# Patient Record
Sex: Female | Born: 1960 | Race: White | Hispanic: No | Marital: Married | Smoking: Current every day smoker
Health system: Southern US, Community
[De-identification: ages and names within clinical notes are randomized; demographics above are authoritative.]

## PROBLEM LIST (undated history)

## (undated) DIAGNOSIS — M8580 Other specified disorders of bone density and structure, unspecified site: Secondary | ICD-10-CM

## (undated) DIAGNOSIS — M1990 Unspecified osteoarthritis, unspecified site: Secondary | ICD-10-CM

## (undated) DIAGNOSIS — Z86010 Personal history of colonic polyps: Secondary | ICD-10-CM

## (undated) DIAGNOSIS — T7840XA Allergy, unspecified, initial encounter: Secondary | ICD-10-CM

## (undated) HISTORY — DX: Unspecified osteoarthritis, unspecified site: M19.90

## (undated) HISTORY — DX: Other specified disorders of bone density and structure, unspecified site: M85.80

## (undated) HISTORY — DX: Allergy, unspecified, initial encounter: T78.40XA

## (undated) HISTORY — PX: NO PAST SURGERIES: SHX2092

## (undated) HISTORY — DX: Personal history of colonic polyps: Z86.010

## (undated) HISTORY — PX: WISDOM TOOTH EXTRACTION: SHX21

---

## 2003-03-09 ENCOUNTER — Other Ambulatory Visit: Admission: RE | Admit: 2003-03-09 | Discharge: 2003-03-09 | Payer: Self-pay | Admitting: Obstetrics and Gynecology

## 2004-04-24 ENCOUNTER — Other Ambulatory Visit: Admission: RE | Admit: 2004-04-24 | Discharge: 2004-04-24 | Payer: Self-pay | Admitting: Obstetrics and Gynecology

## 2009-06-15 ENCOUNTER — Encounter: Admission: RE | Admit: 2009-06-15 | Discharge: 2009-06-15 | Payer: Self-pay | Admitting: Internal Medicine

## 2011-06-30 ENCOUNTER — Other Ambulatory Visit: Payer: Self-pay | Admitting: Obstetrics & Gynecology

## 2011-06-30 DIAGNOSIS — Z1231 Encounter for screening mammogram for malignant neoplasm of breast: Secondary | ICD-10-CM

## 2011-07-29 ENCOUNTER — Ambulatory Visit
Admission: RE | Admit: 2011-07-29 | Discharge: 2011-07-29 | Disposition: A | Discharge status: Home / Self Care | Payer: Commercial Indemnity | Source: Ambulatory Visit | Attending: Obstetrics & Gynecology | Admitting: Obstetrics & Gynecology

## 2011-07-29 DIAGNOSIS — Z1231 Encounter for screening mammogram for malignant neoplasm of breast: Secondary | ICD-10-CM

## 2012-10-05 ENCOUNTER — Other Ambulatory Visit: Payer: Self-pay | Admitting: Obstetrics & Gynecology

## 2012-10-05 DIAGNOSIS — Z1231 Encounter for screening mammogram for malignant neoplasm of breast: Secondary | ICD-10-CM

## 2012-11-01 ENCOUNTER — Ambulatory Visit
Admission: RE | Admit: 2012-11-01 | Discharge: 2012-11-01 | Disposition: A | Discharge status: Home / Self Care | Payer: Commercial Indemnity | Source: Ambulatory Visit | Attending: Obstetrics & Gynecology | Admitting: Obstetrics & Gynecology

## 2012-11-01 DIAGNOSIS — Z1231 Encounter for screening mammogram for malignant neoplasm of breast: Secondary | ICD-10-CM

## 2012-12-22 HISTORY — PX: POLYPECTOMY: SHX149

## 2012-12-22 HISTORY — PX: COLONOSCOPY: SHX174

## 2013-03-07 ENCOUNTER — Encounter: Payer: Self-pay | Admitting: Internal Medicine

## 2013-04-15 ENCOUNTER — Ambulatory Visit (AMBULATORY_SURGERY_CENTER): Payer: Commercial Indemnity

## 2013-04-15 VITALS — Ht 65.0 in | Wt 139.0 lb

## 2013-04-15 DIAGNOSIS — Z1211 Encounter for screening for malignant neoplasm of colon: Secondary | ICD-10-CM

## 2013-04-20 ENCOUNTER — Encounter: Payer: Self-pay | Admitting: Gastroenterology

## 2013-04-22 ENCOUNTER — Encounter: Payer: Commercial Indemnity | Admitting: Internal Medicine

## 2013-04-29 ENCOUNTER — Encounter: Payer: Commercial Indemnity | Admitting: Gastroenterology

## 2013-06-23 ENCOUNTER — Ambulatory Visit (AMBULATORY_SURGERY_CENTER): Payer: Commercial Indemnity | Admitting: Internal Medicine

## 2013-06-23 ENCOUNTER — Encounter: Payer: Self-pay | Admitting: Internal Medicine

## 2013-06-23 VITALS — BP 115/69 | HR 59 | Temp 98.3°F | Resp 17 | Ht 65.0 in | Wt 139.0 lb

## 2013-06-23 DIAGNOSIS — K648 Other hemorrhoids: Secondary | ICD-10-CM

## 2013-06-23 DIAGNOSIS — D129 Benign neoplasm of anus and anal canal: Secondary | ICD-10-CM

## 2013-06-23 DIAGNOSIS — D128 Benign neoplasm of rectum: Secondary | ICD-10-CM

## 2013-06-23 DIAGNOSIS — K644 Residual hemorrhoidal skin tags: Secondary | ICD-10-CM

## 2013-06-23 DIAGNOSIS — Z1211 Encounter for screening for malignant neoplasm of colon: Secondary | ICD-10-CM

## 2013-06-23 DIAGNOSIS — D126 Benign neoplasm of colon, unspecified: Secondary | ICD-10-CM

## 2013-06-23 MED ORDER — SODIUM CHLORIDE 0.9 % IV SOLN: 500.0000 mL | INTRAVENOUS | Status: DC

## 2013-06-23 NOTE — Progress Notes (Signed)
Procedure ends, to recovery, report given and VSS. 

## 2013-06-23 NOTE — Progress Notes (Signed)
0800 Informed pt we needed to place an IV in right arm for her to receive sedation. Pt would not look at me or acknowledge that I was speaking to her.  Cleaned area using protocol and preceded to insert IV catheter. Pt started screaming, crying and whole body was tense..  Immediately had brisk blood return and catheter advanced with no problem.  Asked pt to deep breath and try to relax.  Pt continue to scream and cry. Started IV fluid and taped up catheter. There is no bruising or swelling at site and pt did not verbalize any pain just screaming and crying.

## 2013-06-23 NOTE — Progress Notes (Signed)
No discoloration at site. Denies any pain at site. Stated when applied pressure hurt a little but no drainage or redness at site.

## 2013-06-23 NOTE — Patient Instructions (Addendum)
I found and removed one polyp - from the rectum. You also have hemorrhoids.  If you have hemorrhoid problems (swelling, itching, bleeding) I am able to treat those with an in-office procedure. If you like, please call my office at [phone] to schedule an appointment and I can evaluate you further.  I will let you know pathology results and when to have another routine colonoscopy by mail.  I appreciate the opportunity to care for you. Iva Boop, MD, [facility]  Discharge instructions given with verbal understanding. Handouts on polyps and hemorrhoids given. Hold aspirin and aspirin products for one week. Resume all other medications. YOU HAD AN ENDOSCOPIC PROCEDURE TODAY AT THE [facility]: Refer to the procedure report that was given to you for any specific questions about what was found during the examination.  If the procedure report does not answer your questions, please call your gastroenterologist to clarify.  If you requested that your care partner not be given the details of your procedure findings, then the procedure report has been included in a sealed envelope for you to review at your convenience later.  YOU SHOULD EXPECT: Some feelings of bloating in the abdomen. Passage of more gas than usual.  Walking can help get rid of the air that was put into your GI tract during the procedure and reduce the bloating. If you had a lower endoscopy (such as a colonoscopy or flexible sigmoidoscopy) you may notice spotting of blood in your stool or on the toilet paper. If you underwent a bowel prep for your procedure, then you may not have a normal bowel movement for a few days.  DIET: Your first meal following the procedure should be a light meal and then it is ok to progress to your normal diet.  A half-sandwich or bowl of soup is an example of a good first meal.  Heavy or fried foods are harder to digest and may make you feel nauseous or bloated.  Likewise meals heavy in dairy and  vegetables can cause extra gas to form and this can also increase the bloating.  Drink plenty of fluids but you should avoid alcoholic beverages for 24 hours.  ACTIVITY: Your care partner should take you home directly after the procedure.  You should plan to take it easy, moving slowly for the rest of the day.  You can resume normal activity the day after the procedure however you should NOT DRIVE or use heavy machinery for 24 hours (because of the sedation medicines used during the test).    SYMPTOMS TO REPORT IMMEDIATELY: A gastroenterologist can be reached at any hour.  During normal business hours, 8:30 AM to 5:00 PM Monday through Friday, call [phone].  After hours and on weekends, please call the GI answering service at [phone] who will take a message and have the physician on call contact you.   Following lower endoscopy (colonoscopy or flexible sigmoidoscopy):  Excessive amounts of blood in the stool  Significant tenderness or worsening of abdominal pains  Swelling of the abdomen that is new, acute  Fever of 100F or higher  FOLLOW UP: If any biopsies were taken you will be contacted by phone or by letter within the next 1-3 weeks.  Call your gastroenterologist if you have not heard about the biopsies in 3 weeks.  Our staff will call the home number listed on your records the next business day following your procedure to check on you and address any questions or concerns that  you may have at that time regarding the information given to you following your procedure. This is a courtesy call and so if there is no answer at the home number and we have not heard from you through the emergency physician on call, we will assume that you have returned to your regular daily activities without incident.  SIGNATURES/CONFIDENTIALITY: You and/or your care partner have signed paperwork which will be entered into your electronic medical record.  These signatures attest to the fact that that  the information above on your After Visit Summary has been reviewed and is understood.  Full responsibility of the confidentiality of this discharge information lies with you and/or your care-partner.

## 2013-06-23 NOTE — Progress Notes (Signed)
Called to room to assist during endoscopic procedure.  Patient ID and intended procedure confirmed with present staff. Received instructions for my participation in the procedure from the performing physician.  

## 2013-06-23 NOTE — Op Note (Signed)
 [facility] [address] [place], [address]   COLONOSCOPY PROCEDURE REPORT  PATIENT: Caitlin, Sanchez  MR#: [id number] BIRTHDATE: 07-29-1961 , 51  yrs. old GENDER: Female ENDOSCOPIST: Iva Boop, MD, [facility] REFERRED BY:W.  Buren Kos, M.D. PROCEDURE DATE:  06/23/2013 PROCEDURE:   Colonoscopy with snare polypectomy ASA CLASS:   Class II INDICATIONS:average risk screening and first colonoscopy. MEDICATIONS: Propofol (Diprivan) 280 mg IV, MAC sedation, administered by CRNA, and These medications were titrated to patient response per physician's verbal order  DESCRIPTION OF PROCEDURE:   After the risks benefits and alternatives of the procedure were thoroughly explained, informed consent was obtained.  A digital rectal exam revealed no abnormalities of the rectum.   The LB [id number]  endoscope was introduced through the anus and advanced to the cecum, which was identified by both the appendix and ileocecal valve. No adverse events experienced.   The quality of the prep was Suprep adequate The instrument was then slowly withdrawn as the colon was fully examined.      COLON FINDINGS: A sessile polyp measuring 8 mm in size was found in the rectum.  A polypectomy was performed using snare cautery.  The resection was complete and the polyp tissue was completely retrieved.   The colon mucosa was otherwise normal.   A right colon retroflexion was performed.  Retroflexed views revealed internal/external hemorrhoids. The time to cecum=6 minutes 34 seconds.  Withdrawal time=12 minutes 42 seconds.  The scope was withdrawn and the procedure completed. COMPLICATIONS: There were no complications.  ENDOSCOPIC IMPRESSION: 1.   Sessile polyp measuring 8 mm in size was found in the rectum; polypectomy was performed using snare cautery 2.   The colon mucosa was otherwise normal - adequate prep - first colonoscopy  RECOMMENDATIONS: Timing of repeat colonoscopy  will be determined by pathology findings.   eSigned:  Iva Boop, MD, [facility] 06/23/2013 8:46 AM   cc: Kari Baars, MD and The Patient

## 2013-06-23 NOTE — Progress Notes (Signed)
Patient did not experience any of the following events: a burn prior to discharge; a fall within the facility; wrong site/side/patient/procedure/implant event; or a hospital transfer or hospital admission upon discharge from the facility. (G8907) Patient did not have preoperative order for IV antibiotic SSI prophylaxis. (G8918)  

## 2013-06-23 NOTE — Progress Notes (Signed)
IV site without s/s of swelling or infiltration. Able to move right arm without any problem.

## 2013-06-27 ENCOUNTER — Telehealth: Payer: Self-pay | Admitting: *Deleted

## 2013-06-27 NOTE — Telephone Encounter (Signed)
Message left

## 2013-06-30 ENCOUNTER — Encounter: Payer: Self-pay | Admitting: Internal Medicine

## 2013-06-30 DIAGNOSIS — Z860100 Personal history of colon polyps, unspecified: Secondary | ICD-10-CM | POA: Insufficient documentation

## 2013-06-30 HISTORY — DX: Personal history of colonic polyps: Z86.010

## 2013-06-30 NOTE — Progress Notes (Signed)
Quick Note:  8 mm tubular adenoma Repeat colonoscopy about 06/2018 ______

## 2014-10-16 ENCOUNTER — Ambulatory Visit (INDEPENDENT_AMBULATORY_CARE_PROVIDER_SITE_OTHER): Payer: Managed Care, Other (non HMO) | Admitting: Podiatry

## 2014-10-16 ENCOUNTER — Encounter: Payer: Self-pay | Admitting: Podiatry

## 2014-10-16 VITALS — BP 107/74 | HR 81 | Resp 16

## 2014-10-16 DIAGNOSIS — L600 Ingrowing nail: Secondary | ICD-10-CM

## 2014-10-16 NOTE — Progress Notes (Signed)
Subjective:     Patient ID: Caitlin Sanchez, female   DOB: 1961-08-09, 53 y.o.   MRN: [id number]  HPI patient presents stating my toenail has been bothering me and I wanted to get it checked again   Review of Systems     Objective:   Physical Exam Neurovascular status intact with what appears to be a normal appearing right big toenail with moderate abnormality in the toe itself position wise    Assessment:     Possible small ingrown toenail versus positional deformity creating stress against the bone structure    Plan:     Educated patient on this and do not recommend removing another nail corner. Did apply padding to reduce stress against the toe and we will see if this resolves her symptoms

## 2015-07-17 ENCOUNTER — Emergency Department (HOSPITAL_COMMUNITY): Payer: Commercial Indemnity

## 2015-07-17 ENCOUNTER — Emergency Department (HOSPITAL_COMMUNITY)
Admission: EM | Admit: 2015-07-17 | Discharge: 2015-07-17 | Disposition: A | Discharge status: Home / Self Care | Payer: Commercial Indemnity | Attending: Emergency Medicine | Admitting: Emergency Medicine

## 2015-07-17 ENCOUNTER — Encounter (HOSPITAL_COMMUNITY): Payer: Self-pay | Admitting: Emergency Medicine

## 2015-07-17 DIAGNOSIS — I9589 Other hypotension: Secondary | ICD-10-CM | POA: Diagnosis not present

## 2015-07-17 DIAGNOSIS — Z720 Tobacco use: Secondary | ICD-10-CM | POA: Diagnosis not present

## 2015-07-17 DIAGNOSIS — Z3202 Encounter for pregnancy test, result negative: Secondary | ICD-10-CM | POA: Insufficient documentation

## 2015-07-17 DIAGNOSIS — R1084 Generalized abdominal pain: Secondary | ICD-10-CM | POA: Diagnosis not present

## 2015-07-17 DIAGNOSIS — R1011 Right upper quadrant pain: Secondary | ICD-10-CM

## 2015-07-17 DIAGNOSIS — M1990 Unspecified osteoarthritis, unspecified site: Secondary | ICD-10-CM | POA: Insufficient documentation

## 2015-07-17 LAB — COMPREHENSIVE METABOLIC PANEL
ALT: 14 U/L (ref 14–54)
AST: 20 U/L (ref 15–41)
Albumin: 4.1 g/dL (ref 3.5–5.0)
Alkaline Phosphatase: 41 U/L (ref 38–126)
Anion gap: 9 (ref 5–15)
BUN: 11 mg/dL (ref 6–20)
CHLORIDE: 105 mmol/L (ref 101–111)
CO2: 26 mmol/L (ref 22–32)
CREATININE: 0.81 mg/dL (ref 0.44–1.00)
Calcium: 9.1 mg/dL (ref 8.9–10.3)
GFR calc Af Amer: >60 mL/min (ref >60)
GFR calc non Af Amer: >60 mL/min (ref >60)
GLUCOSE: 97 mg/dL (ref 65–99)
POTASSIUM: 4.4 mmol/L (ref 3.5–5.1)
Sodium: 140 mmol/L (ref 135–145)
TOTAL PROTEIN: 6.8 g/dL (ref 6.5–8.1)
Total Bilirubin: 0.6 mg/dL (ref 0.3–1.2)

## 2015-07-17 LAB — URINALYSIS, ROUTINE W REFLEX MICROSCOPIC
BILIRUBIN URINE: NEGATIVE
Bilirubin Urine: NEGATIVE
GLUCOSE, UA: NEGATIVE mg/dL
GLUCOSE, UA: NEGATIVE mg/dL
HGB URINE DIPSTICK: NEGATIVE
Hgb urine dipstick: NEGATIVE
KETONES UR: NEGATIVE mg/dL
Ketones, ur: 15 mg/dL — AB
Leukocytes, UA: NEGATIVE
NITRITE: NEGATIVE
Nitrite: NEGATIVE
PH: 7 (ref 5.0–8.0)
PH: 5.5 (ref 5.0–8.0)
PROTEIN: NEGATIVE mg/dL
Protein, ur: NEGATIVE mg/dL
SPECIFIC GRAVITY, URINE: 1.01 (ref 1.005–1.030)
Specific Gravity, Urine: 1.023 (ref 1.005–1.030)
UROBILINOGEN UA: 1 mg/dL (ref 0.0–1.0)
Urobilinogen, UA: 0.2 mg/dL (ref 0.0–1.0)

## 2015-07-17 LAB — I-STAT BETA HCG BLOOD, ED (MC, WL, AP ONLY)

## 2015-07-17 LAB — CBC
HEMATOCRIT: 43.7 % (ref 36.0–46.0)
HEMOGLOBIN: 14.9 g/dL (ref 12.0–15.0)
MCH: 32.4 pg (ref 26.0–34.0)
MCHC: 34.1 g/dL (ref 30.0–36.0)
MCV: 95 fL (ref 78.0–100.0)
Platelets: 316 10*3/uL (ref 150–400)
RBC: 4.6 MIL/uL (ref 3.87–5.11)
RDW: 14.3 % (ref 11.5–15.5)
WBC: 8.1 10*3/uL (ref 4.0–10.5)

## 2015-07-17 LAB — URINE MICROSCOPIC-ADD ON

## 2015-07-17 LAB — I-STAT TROPONIN, ED: TROPONIN I, POC: 0.01 ng/mL (ref 0.00–0.08)

## 2015-07-17 LAB — I-STAT CG4 LACTIC ACID, ED: Lactic Acid, Venous: 1.48 mmol/L (ref 0.5–2.0)

## 2015-07-17 LAB — LIPASE, BLOOD: LIPASE: 32 U/L (ref 22–51)

## 2015-07-17 MED ORDER — SODIUM CHLORIDE 0.9 % IV BOLUS (SEPSIS)
1000.0000 mL | Freq: Once | INTRAVENOUS | Status: AC
  Administered 2015-07-17: 1000 mL via INTRAVENOUS

## 2015-07-17 MED ORDER — OXYCODONE-ACETAMINOPHEN 5-325 MG PO TABS: 15.0000 | ORAL_TABLET | ORAL | Status: DC | PRN

## 2015-07-17 MED ORDER — OXYCODONE-ACETAMINOPHEN 5-325 MG PO TABS: 1.0000 | ORAL_TABLET | ORAL | Status: DC | PRN

## 2015-07-17 MED ORDER — FENTANYL CITRATE (PF) 100 MCG/2ML IJ SOLN
100.0000 ug | Freq: Once | INTRAMUSCULAR | Status: AC
Start: 2015-07-17 — End: 2015-07-17
  Administered 2015-07-17: 100 ug via INTRAVENOUS
  Filled 2015-07-17: qty 2

## 2015-07-17 MED ORDER — ONDANSETRON HCL 4 MG/2ML IJ SOLN
4.0000 mg | Freq: Once | INTRAMUSCULAR | Status: AC
  Administered 2015-07-17: 4 mg via INTRAVENOUS
  Filled 2015-07-17: qty 2

## 2015-07-17 MED ORDER — FENTANYL CITRATE (PF) 100 MCG/2ML IJ SOLN
50.0000 ug | Freq: Once | INTRAMUSCULAR | Status: AC
  Administered 2015-07-17: 50 ug via INTRAVENOUS
  Filled 2015-07-17: qty 2

## 2015-07-17 MED ORDER — KETOROLAC TROMETHAMINE 30 MG/ML IJ SOLN
30.0000 mg | Freq: Once | INTRAMUSCULAR | Status: AC
  Administered 2015-07-17: 30 mg via INTRAVENOUS
  Filled 2015-07-17: qty 1

## 2015-07-17 MED ORDER — ONDANSETRON 4 MG PO TBDP: ORAL_TABLET | ORAL | Status: DC

## 2015-07-17 MED ORDER — DEXTROSE 5 % IV SOLN
1.0000 g | Freq: Once | INTRAVENOUS | Status: AC
  Administered 2015-07-17: 1 g via INTRAVENOUS
  Filled 2015-07-17: qty 10

## 2015-07-17 MED ORDER — MORPHINE SULFATE 4 MG/ML IJ SOLN
4.0000 mg | Freq: Once | INTRAMUSCULAR | Status: DC
  Filled 2015-07-17: qty 1

## 2015-07-17 MED ORDER — ONDANSETRON HCL 4 MG/2ML IJ SOLN
4.0000 mg | Freq: Once | INTRAMUSCULAR | Status: AC | PRN
  Administered 2015-07-17: 4 mg via INTRAVENOUS
  Filled 2015-07-17: qty 2

## 2015-07-17 MED ORDER — MORPHINE SULFATE 4 MG/ML IJ SOLN
4.0000 mg | Freq: Once | INTRAMUSCULAR | Status: AC
  Administered 2015-07-17: 4 mg via INTRAVENOUS
  Filled 2015-07-17: qty 1

## 2015-07-17 MED ORDER — MORPHINE SULFATE 4 MG/ML IJ SOLN
4.0000 mg | Freq: Once | INTRAMUSCULAR | Status: DC
Start: 2015-07-17 — End: 2015-07-17

## 2015-07-17 MED ORDER — ONDANSETRON 4 MG PO TBDP
4.0000 mg | ORAL_TABLET | Freq: Once | ORAL | Status: AC
  Administered 2015-07-17: 4 mg via ORAL
  Filled 2015-07-17: qty 1

## 2015-07-17 MED ORDER — IOHEXOL 350 MG/ML SOLN
100.0000 mL | Freq: Once | INTRAVENOUS | Status: AC | PRN
  Administered 2015-07-17: 100 mL via INTRAVENOUS

## 2015-07-17 MED ORDER — OXYCODONE-ACETAMINOPHEN 5-325 MG PO TABS
2.0000 | ORAL_TABLET | Freq: Once | ORAL | Status: AC
  Administered 2015-07-17: 2 via ORAL
  Filled 2015-07-17: qty 2

## 2015-07-17 NOTE — ED Provider Notes (Signed)
CSN: [id number]     Arrival date & time 07/17/15  7893 History   First MD Initiated Contact with Patient 07/17/15 [phone]     Chief Complaint  Patient presents with  . Abdominal Pain     (Consider location/radiation/quality/duration/timing/severity/associated sxs/prior Treatment) HPI Caitlin Sanchez is a 54 y.o. female who comes in for evaluation of sudden onset right upper quadrant pain. Patient states pain started approximately 4:30 AM this morning. She had associated nausea, tried to drink some water and had subsequent watery vomit. She reports the discomfort as a constant dull ache with sharp, intermittent stabbing pain that she rates as a 5/10 now in the ED. She denies any other abdominal pain, pelvic pain, fevers, chills, chest pain, shortness of breath, new cough, urinary symptoms, back pain. Current half pack per day smoker.  Past Medical History  Diagnosis Date  . Arthritis   . Allergy     dust and possibly ASA  . Personal history of colonic adenoma 06/30/2013   Past Surgical History  Procedure Laterality Date  . No past surgeries     Family History  Problem Relation Age of Onset  . Colon cancer Neg Hx    History  Substance Use Topics  . Smoking status: Current Every Day Smoker -- 0.50 packs/day    Types: Cigarettes  . Smokeless tobacco: Never Used  . Alcohol Use: 2.4 oz/week    4 Glasses of wine per week   OB History    No data available     Review of Systems A 10 point review of systems was completed and was negative except for pertinent positives and negatives as mentioned in the history of present illness     Allergies  Aspirin  Home Medications   Prior to Admission medications   Medication Sig Start Date End Date Taking? Authorizing Provider  ibuprofen (ADVIL,MOTRIN) 200 MG tablet Take 400-600 mg by mouth every 6 (six) hours as needed for mild pain or moderate pain.   Yes Historical Provider, MD  ondansetron (ZOFRAN ODT) 4 MG disintegrating tablet 4mg   ODT q4 hours prn nausea/vomit 07/17/15   Comer Locket, PA-C  oxyCODONE-acetaminophen (PERCOCET/ROXICET) 5-325 MG per tablet Take 1-2 tablets by mouth every 4 (four) hours as needed for severe pain. 07/17/15   Comer Locket, PA-C   BP 108/53 mmHg  Pulse 52  Temp(Src) 98.7 F (37.1 C) (Rectal)  Resp 13  SpO2 99%  LMP 07/09/2015 Physical Exam  Constitutional: She is oriented to person, place, and time. She appears well-developed and well-nourished.  HENT:  Head: Normocephalic and atraumatic.  Mouth/Throat: Oropharynx is clear and moist.  Eyes: Conjunctivae are normal. Pupils are equal, round, and reactive to light. Right eye exhibits no discharge. Left eye exhibits no discharge. No scleral icterus.  Neck: Neck supple.  Cardiovascular: Normal rate, regular rhythm, normal heart sounds and intact distal pulses.   Pulses intact, radial and dorsalis pedis equal and bounding bilaterally.  Pulmonary/Chest: Effort normal and breath sounds normal. No respiratory distress. She has no wheezes. She has no rales.  Abdominal: Soft.  Diffuse tenderness throughout abdomen. Abdomen is soft and nondistended. No rebound or guarding. No overt Murphy sign  Musculoskeletal: She exhibits no tenderness.  Neurological: She is alert and oriented to person, place, and time.  Cranial Nerves II-XII grossly intact  Skin: Skin is warm and dry. No rash noted.  Psychiatric: She has a normal mood and affect.  Nursing note and vitals reviewed.   ED Course  Procedures (including  critical care time) Labs Review Labs Reviewed  URINALYSIS, ROUTINE W REFLEX MICROSCOPIC (NOT AT [facility]) - Abnormal; Notable for the following:    APPearance HAZY (*)    Leukocytes, UA SMALL (*)    All other components within normal limits  URINE MICROSCOPIC-ADD ON - Abnormal; Notable for the following:    Squamous Epithelial / LPF MANY (*)    Bacteria, UA MANY (*)    All other components within normal limits  URINALYSIS, ROUTINE W  REFLEX MICROSCOPIC (NOT AT [facility]) - Abnormal; Notable for the following:    Ketones, ur 15 (*)    All other components within normal limits  URINE CULTURE  LIPASE, BLOOD  COMPREHENSIVE METABOLIC PANEL  CBC  I-STAT BETA HCG BLOOD, ED ([facility], [facility], [facility] ONLY)  I-STAT CG4 LACTIC ACID, ED  I-STAT TROPOININ, ED    Imaging Review Ct Angio Chest Aorta W/cm &/or Wo/cm  07/17/2015   CLINICAL DATA:  Abdominal pain. Severe onset of abdominal pain radiating to the RIGHT upper quadrant. Epigastric pain.  EXAM: CT ANGIOGRAPHY CHEST, ABDOMEN AND PELVIS  TECHNIQUE: Multidetector CT imaging through the chest, abdomen and pelvis was performed using the standard protocol during bolus administration of intravenous contrast. Multiplanar reconstructed images and MIPs were obtained and reviewed to evaluate the vascular anatomy.  CONTRAST:  135mL OMNIPAQUE IOHEXOL 350 MG/ML SOLN  COMPARISON:  None.  FINDINGS: CTA CHEST FINDINGS  Bones: Thoracic vertebral body height is within normal limits. No aggressive osseous lesions.  Cardiovascular: Negative for acute aortic abnormality. Pulmonary arteries are adequately opacified. Negative for pulmonary embolism. Heart appears within normal limits.  Lungs: Respiratory motion artifact is present in the lungs. Dependent atelectasis. There is no airspace disease.  Scattered peripheral pulmonary nodules are present. Largest is in the LEFT lower lobe measuring 4 mm (image 84 series 11).  Central airways: Tenacious secretions in the RIGHT side of the trachea. Otherwise normal.  Effusions: None.  Lymphadenopathy: Normal.  Esophagus: Nonspecific distal esophageal thickening is present, most commonly associated with gastroesophageal reflux.  Upper abdomen: See below.  Other: None.  Review of the MIP images confirms the above findings.  CTA ABDOMEN AND PELVIS FINDINGS  Musculoskeletal: Grade II anterolisthesis of L5 on S1 associated with chronic bilateral L5 pars defects. No aggressive osseous lesions.   Lung Bases: See above.  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  Spleen:  Normal.  Gallbladder:  Normal.  Common bile duct:  Normal.  Pancreas:  Normal.  Adrenal glands:  Normal bilaterally.  Kidneys: Normal renal enhancement. No hydronephrosis. No calculi. LEFT ureter appears normal. RIGHT ureter appears normal.  Stomach:  Grossly normal.  Small bowel:  Grossly normal.  Colon: Normal appendix. Large stool burden in the RIGHT colon. The transverse colon is markedly redundant, with redundant loop in the central upper abdomen.  Pelvic Genitourinary: Normal urinary bladder, partially collapsed. Uterus and adnexa appear normal.  Peritoneum: No free air.  No free fluid.  Vascular/lymphatic: As noted above, periportal edema is present in the liver, likely related to volume status. The abdominal aorta and mesenteric vasculature is patent. No stenosis or occlusion. No secondary findings of mesenteric ischemia.  Body Wall: Normal.  Review of the MIP images confirms the above findings.  IMPRESSION: 1. No acute vascular abnormality. 2. Scattered peripheral 4 mm and smaller pulmonary nodules. If the patient is at high risk for bronchogenic  carcinoma, follow-up chest CT at 1 year is recommended. If the patient is at low risk, no follow-up is needed. This recommendation follows the consensus statement: Guidelines for Management of Small Pulmonary Nodules Detected on CT Scans: A Statement from the [employer] as published in Radiology 2005; 237:395-400. 3. Distal esophageal thickening, nonspecific. This is most commonly associated with reflux. 4. Periportal edema likely related to volume status. 5. Redundant transverse colon with large right-sided stool burden.   Electronically Signed   By: Dereck Ligas M.D.   On: 07/17/2015 11:07   Ct Cta Abd/pel W/cm  &/or W/o Cm  07/17/2015   CLINICAL DATA:  Abdominal pain. Severe onset of abdominal pain radiating to the RIGHT upper quadrant. Epigastric pain.  EXAM: CT ANGIOGRAPHY CHEST, ABDOMEN AND PELVIS  TECHNIQUE: Multidetector CT imaging through the chest, abdomen and pelvis was performed using the standard protocol during bolus administration of intravenous contrast. Multiplanar reconstructed images and MIPs were obtained and reviewed to evaluate the vascular anatomy.  CONTRAST:  138mL OMNIPAQUE IOHEXOL 350 MG/ML SOLN  COMPARISON:  None.  FINDINGS: CTA CHEST FINDINGS  Bones: Thoracic vertebral body height is within normal limits. No aggressive osseous lesions.  Cardiovascular: Negative for acute aortic abnormality. Pulmonary arteries are adequately opacified. Negative for pulmonary embolism. Heart appears within normal limits.  Lungs: Respiratory motion artifact is present in the lungs. Dependent atelectasis. There is no airspace disease.  Scattered peripheral pulmonary nodules are present. Largest is in the LEFT lower lobe measuring 4 mm (image 84 series 11).  Central airways: Tenacious secretions in the RIGHT side of the trachea. Otherwise normal.  Effusions: None.  Lymphadenopathy: Normal.  Esophagus: Nonspecific distal esophageal thickening is present, most commonly associated with gastroesophageal reflux.  Upper abdomen: See below.  Other: None.  Review of the MIP images confirms the above findings.  CTA ABDOMEN AND PELVIS FINDINGS  Musculoskeletal: Grade II anterolisthesis of L5 on S1 associated with chronic bilateral L5 pars defects. No aggressive osseous lesions.  Lung Bases: See above.  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  Spleen:  Normal.  Gallbladder:  Normal.  Common bile duct:  Normal.  Pancreas:  Normal.  Adrenal glands:  Normal bilaterally.   Kidneys: Normal renal enhancement. No hydronephrosis. No calculi. LEFT ureter appears normal. RIGHT ureter appears normal.  Stomach:  Grossly normal.  Small bowel:  Grossly normal.  Colon: Normal appendix. Large stool burden in the RIGHT colon. The transverse colon is markedly redundant, with redundant loop in the central upper abdomen.  Pelvic Genitourinary: Normal urinary bladder, partially collapsed. Uterus and adnexa appear normal.  Peritoneum: No free air.  No free fluid.  Vascular/lymphatic: As noted above, periportal edema is present in the liver, likely related to volume status. The abdominal aorta and mesenteric vasculature is patent. No stenosis or occlusion. No secondary findings of mesenteric ischemia.  Body Wall: Normal.  Review of the MIP images confirms the above findings.  IMPRESSION: 1. No acute vascular abnormality. 2. Scattered peripheral 4 mm and smaller pulmonary nodules. If the patient is at high risk for bronchogenic carcinoma, follow-up chest CT at 1 year is recommended. If the patient is at low risk, no follow-up is needed. This recommendation follows the consensus statement: Guidelines for Management of Small Pulmonary Nodules Detected on CT Scans: A Statement from the [employer] as published in Radiology 2005; 237:395-400. 3. Distal  esophageal thickening, nonspecific. This is most commonly associated with reflux. 4. Periportal edema likely related to volume status. 5. Redundant transverse colon with large right-sided stool burden.   Electronically Signed   By: Dereck Ligas M.D.   On: 07/17/2015 11:07   US Abdomen Limited Ruq  07/17/2015   CLINICAL DATA:  Right upper quadrant and epigastric pain for several hours.  EXAM: US ABDOMEN LIMITED - RIGHT UPPER QUADRANT  COMPARISON:  None.  FINDINGS: Gallbladder:  No gallstones or wall thickening visualized. No sonographic Murphy sign noted.  Common bile duct:  Diameter: 4.6 mm  Liver:  No focal lesion identified. Within normal  limits in parenchymal echogenicity.  IMPRESSION: Normal gallbladder.  Right upper quadrant and epigastric pain for several hours. Please select the correct template based on the type of Limited Abdominal US exam - i.e. RUQ, Ascites, Appendix, Pylorus, etc   Electronically Signed   By: Abelardo Diesel M.D.   On: 07/17/2015 09:38     EKG Interpretation None     Meds given in ED:  Medications  ondansetron (ZOFRAN) injection 4 mg (4 mg Intravenous Given 07/17/15 0749)  fentaNYL (SUBLIMAZE) injection 50 mcg (50 mcg Intravenous Given 07/17/15 0749)  fentaNYL (SUBLIMAZE) injection 100 mcg (100 mcg Intravenous Given 07/17/15 0837)  sodium chloride 0.9 % bolus 1,000 mL (0 mLs Intravenous Stopped 07/17/15 0937)  sodium chloride 0.9 % bolus 1,000 mL (0 mLs Intravenous Stopped 07/17/15 0836)  ketorolac (TORADOL) 30 MG/ML injection 30 mg (30 mg Intravenous Given 07/17/15 0950)  sodium chloride 0.9 % bolus 1,000 mL (0 mLs Intravenous Stopped 07/17/15 1056)  iohexol (OMNIPAQUE) 350 MG/ML injection 100 mL (100 mLs Intravenous Contrast Given 07/17/15 1014)  fentaNYL (SUBLIMAZE) injection 50 mcg (50 mcg Intravenous Given 07/17/15 1216)  ketorolac (TORADOL) 30 MG/ML injection 30 mg (30 mg Intravenous Given 07/17/15 1200)  cefTRIAXone (ROCEPHIN) 1 g in dextrose 5 % 50 mL IVPB (0 g Intravenous Stopped 07/17/15 1247)  ondansetron (ZOFRAN) injection 4 mg (4 mg Intravenous Given 07/17/15 1252)  morphine 4 MG/ML injection 4 mg (4 mg Intravenous Given 07/17/15 1336)  oxyCODONE-acetaminophen (PERCOCET/ROXICET) 5-325 MG per tablet 2 tablet (2 tablets Oral Given 07/17/15 1510)  ondansetron (ZOFRAN-ODT) disintegrating tablet 4 mg (4 mg Oral Given 07/17/15 1510)    Discharge Medication List as of 07/17/2015  3:04 PM    START taking these medications   Details  ondansetron (ZOFRAN ODT) 4 MG disintegrating tablet 4mg  ODT q4 hours prn nausea/vomit, Print    oxyCODONE-acetaminophen (PERCOCET/ROXICET) 5-325 MG per tablet Take 15 tablets  by mouth every 4 (four) hours as needed for severe pain., Starting 07/17/2015, Until Discontinued, Print       Filed Vitals:   07/17/15 1326 07/17/15 1330 07/17/15 1415 07/17/15 1500  BP: 97/56 100/64 94/48 108/53  Pulse: 49 49 47 52  Temp:      TempSrc:      Resp: 14 12 15 13   SpO2: 100% 100% 100% 99%    MDM  Vitals stable - WNL -afebrile Pt resting comfortably in ED. PE--patient with diffuse abdominal tenderness, worse in the right upper quadrant epigastrium without any evidence of source. Labwork--labs are noncontributory. Negative troponin, lactic acid. Repeat in and out urinalysis is clean and shows no evidence of infection.  Imaging--CT angiogram of abdomen and pelvis shows no acute vascular abnormalities. There are scattered peripheral 4 mm pulmonary nodules that I discussed with the patient and she will need follow-up CT. There is also nonspecific distal esophageal thickening, periportal edema  likely secondary to volume status and a redundant transverse colon with large right-sided stool burden. Abdominal ultrasound also negative for any acute pathology.  At this time, patient's pain is controlled in the ED. Discussed with my attending, Dr. Reather Converse who also saw and evaluated the patient and agrees it is reasonable to discharge patient home with short course pain medicines and have her follow-up with PCP for further evaluation management of symptoms. There is no evidence of other acute or emergent pathology at this time. Doubt cardiopulmonary etiology. I discussed all relevant lab findings and imaging results with pt and they verbalized understanding. I personally reviewed the imaging and agree with the results as interpreted by the radiologist.  Discussed f/u with PCP within 48 hrs and return precautions, pt very amenable to plan.   Final diagnoses:  Other specified hypotension  Right upper quadrant abdominal pain      Comer Locket, PA-C 07/18/15 2376  Elnora Morrison,  MD 07/18/15 [phone]

## 2015-07-17 NOTE — Discharge Instructions (Signed)
You were evaluated in the ED today for your abdominal pain. There is not appear to be an emergent cause your symptoms at this time. Your exam, labs, ultrasound, CT of your abdomen and chest are all reassuring. It is important for you to follow-up with your PCP for further evaluation and management of your symptoms. Please take your pain medicine as prescribed. You may take 1-2 tablets every 6 hours as needed for pain or every 4 hours if necessary. Do not drive or operate machinery while taking this medicine. Return to ED for worsening symptoms.  Abdominal Pain, Women Abdominal (stomach, pelvic, or belly) pain can be caused by many things. It is important to tell your doctor:  The location of the pain.  Does it come and go or is it present all the time?  Are there things that start the pain (eating certain foods, exercise)?  Are there other symptoms associated with the pain (fever, nausea, vomiting, diarrhea)? All of this is helpful to know when trying to find the cause of the pain. CAUSES   Stomach: virus or bacteria infection, or ulcer.  Intestine: appendicitis (inflamed appendix), regional ileitis (Crohn's disease), ulcerative colitis (inflamed colon), irritable bowel syndrome, diverticulitis (inflamed diverticulum of the colon), or cancer of the stomach or intestine.  Gallbladder disease or stones in the gallbladder.  Kidney disease, kidney stones, or infection.  Pancreas infection or cancer.  Fibromyalgia (pain disorder).  Diseases of the female organs:  Uterus: fibroid (non-cancerous) tumors or infection.  Fallopian tubes: infection or tubal pregnancy.  Ovary: cysts or tumors.  Pelvic adhesions (scar tissue).  Endometriosis (uterus lining tissue growing in the pelvis and on the pelvic organs).  Pelvic congestion syndrome (female organs filling up with blood just before the menstrual period).  Pain with the menstrual period.  Pain with ovulation (producing an  egg).  Pain with an IUD (intrauterine device, birth control) in the uterus.  Cancer of the female organs.  Functional pain (pain not caused by a disease, may improve without treatment).  Psychological pain.  Depression. DIAGNOSIS  Your doctor will decide the seriousness of your pain by doing an examination.  Blood tests.  X-rays.  Ultrasound.  CT scan (computed tomography, special type of X-ray).  MRI (magnetic resonance imaging).  Cultures, for infection.  Barium enema (dye inserted in the large intestine, to better view it with X-rays).  Colonoscopy (looking in intestine with a lighted tube).  Laparoscopy (minor surgery, looking in abdomen with a lighted tube).  Major abdominal exploratory surgery (looking in abdomen with a large incision). TREATMENT  The treatment will depend on the cause of the pain.   Many cases can be observed and treated at home.  Over-the-counter medicines recommended by your caregiver.  Prescription medicine.  Antibiotics, for infection.  Birth control pills, for painful periods or for ovulation pain.  Hormone treatment, for endometriosis.  Nerve blocking injections.  Physical therapy.  Antidepressants.  Counseling with a psychologist or psychiatrist.  Minor or major surgery. HOME CARE INSTRUCTIONS   Do not take laxatives, unless directed by your caregiver.  Take over-the-counter pain medicine only if ordered by your caregiver. Do not take aspirin because it can cause an upset stomach or bleeding.  Try a clear liquid diet (broth or water) as ordered by your caregiver. Slowly move to a bland diet, as tolerated, if the pain is related to the stomach or intestine.  Have a thermometer and take your temperature several times a day, and record it.  Bed  rest and sleep, if it helps the pain.  Avoid sexual intercourse, if it causes pain.  Avoid stressful situations.  Keep your follow-up appointments and tests, as your caregiver  orders.  If the pain does not go away with medicine or surgery, you may try:  Acupuncture.  Relaxation exercises (yoga, meditation).  Group therapy.  Counseling. SEEK MEDICAL CARE IF:   You notice certain foods cause stomach pain.  Your home care treatment is not helping your pain.  You need stronger pain medicine.  You want your IUD removed.  You feel faint or lightheaded.  You develop nausea and vomiting.  You develop a rash.  You are having side effects or an allergy to your medicine. SEEK IMMEDIATE MEDICAL CARE IF:   Your pain does not go away or gets worse.  You have a fever.  Your pain is felt only in portions of the abdomen. The right side could possibly be appendicitis. The left lower portion of the abdomen could be colitis or diverticulitis.  You are passing blood in your stools (bright red or black tarry stools, with or without vomiting).  You have blood in your urine.  You develop chills, with or without a fever.  You pass out. MAKE SURE YOU:   Understand these instructions.  Will watch your condition.  Will get help right away if you are not doing well or get worse. Document Released: 10/05/2007 Document Revised: 04/24/2014 Document Reviewed: 10/25/2009 [facility] Patient Information 2015 [place], [place]. This information is not intended to replace advice given to you by your health care provider. Make sure you discuss any questions you have with your health care provider.

## 2015-07-17 NOTE — ED Notes (Signed)
Patient transported to Ultrasound 

## 2015-07-17 NOTE — ED Notes (Signed)
Severe abd pain sudden when she woke up, RUQ radiating to epigastric.

## 2015-07-20 LAB — URINE CULTURE

## 2016-08-14 ENCOUNTER — Other Ambulatory Visit: Payer: Self-pay | Admitting: Internal Medicine

## 2016-08-14 DIAGNOSIS — R911 Solitary pulmonary nodule: Secondary | ICD-10-CM

## 2016-08-21 ENCOUNTER — Ambulatory Visit
Admission: RE | Admit: 2016-08-21 | Discharge: 2016-08-21 | Disposition: A | Discharge status: Home / Self Care | Payer: Managed Care, Other (non HMO) | Source: Ambulatory Visit | Attending: Internal Medicine | Admitting: Internal Medicine

## 2016-08-21 DIAGNOSIS — R911 Solitary pulmonary nodule: Secondary | ICD-10-CM

## 2016-08-21 MED ORDER — IOPAMIDOL (ISOVUE-300) INJECTION 61%
75.0000 mL | Freq: Once | INTRAVENOUS | Status: AC | PRN
  Administered 2016-08-21: 75 mL via INTRAVENOUS

## 2017-07-22 DIAGNOSIS — M8580 Other specified disorders of bone density and structure, unspecified site: Secondary | ICD-10-CM

## 2017-07-22 HISTORY — DX: Other specified disorders of bone density and structure, unspecified site: M85.80

## 2017-08-04 ENCOUNTER — Ambulatory Visit (INDEPENDENT_AMBULATORY_CARE_PROVIDER_SITE_OTHER): Payer: Managed Care, Other (non HMO) | Admitting: Obstetrics & Gynecology

## 2017-08-04 ENCOUNTER — Other Ambulatory Visit: Payer: Self-pay | Admitting: Gynecology

## 2017-08-04 ENCOUNTER — Encounter: Payer: Self-pay | Admitting: Obstetrics & Gynecology

## 2017-08-04 VITALS — BP 102/70 | Ht 65.5 in | Wt 133.0 lb

## 2017-08-04 DIAGNOSIS — Z1151 Encounter for screening for human papillomavirus (HPV): Secondary | ICD-10-CM

## 2017-08-04 DIAGNOSIS — Z01419 Encounter for gynecological examination (general) (routine) without abnormal findings: Secondary | ICD-10-CM

## 2017-08-04 DIAGNOSIS — Z780 Asymptomatic menopausal state: Secondary | ICD-10-CM | POA: Diagnosis not present

## 2017-08-04 DIAGNOSIS — Z13820 Encounter for screening for osteoporosis: Secondary | ICD-10-CM

## 2017-08-04 LAB — COMPREHENSIVE METABOLIC PANEL
ALK PHOS: 52 U/L (ref 33–130)
ALT: 18 U/L (ref 6–29)
AST: 20 U/L (ref 10–35)
Albumin: 4.5 g/dL (ref 3.6–5.1)
BUN: 16 mg/dL (ref 7–25)
CALCIUM: 9.5 mg/dL (ref 8.6–10.4)
CHLORIDE: 104 mmol/L (ref 98–110)
CO2: 27 mmol/L (ref 20–32)
Creat: 0.78 mg/dL (ref 0.50–1.05)
Glucose, Bld: 89 mg/dL (ref 65–99)
POTASSIUM: 4.5 mmol/L (ref 3.5–5.3)
Sodium: 139 mmol/L (ref 135–146)
TOTAL PROTEIN: 6.8 g/dL (ref 6.1–8.1)
Total Bilirubin: 0.3 mg/dL (ref 0.2–1.2)

## 2017-08-04 LAB — CBC
HEMATOCRIT: 44.3 % (ref 35.0–45.0)
Hemoglobin: 14.7 g/dL (ref 11.7–15.5)
MCH: 32 pg (ref 27.0–33.0)
MCHC: 33.2 g/dL (ref 32.0–36.0)
MCV: 96.5 fL (ref 80.0–100.0)
MPV: 9.6 fL (ref 7.5–12.5)
Platelets: 387 10*3/uL (ref 140–400)
RBC: 4.59 MIL/uL (ref 3.80–5.10)
RDW: 14 % (ref 11.0–15.0)
WBC: 6 10*3/uL (ref 3.8–10.8)

## 2017-08-04 LAB — LIPID PANEL
Cholesterol: 229 mg/dL — ABNORMAL HIGH (ref <200)
HDL: 66 mg/dL (ref >50)
LDL Cholesterol: 148 mg/dL — ABNORMAL HIGH (ref <100)
TRIGLYCERIDES: 76 mg/dL (ref <150)
Total CHOL/HDL Ratio: 3.5 Ratio (ref <5.0)
VLDL: 15 mg/dL (ref <30)

## 2017-08-04 LAB — TSH: TSH: 1.8 m[IU]/L

## 2017-08-04 NOTE — Addendum Note (Signed)
Addended by: Thurnell Garbe A on: 08/04/2017 08:43 AM   Modules accepted: Orders

## 2017-08-04 NOTE — Progress Notes (Signed)
Caitlin Sanchez 27-Jul-1961 [id number]   History:    56 y.o. G0 Married  RP:  Established patient presenting for annual gyn exam  HPI:   Menopause.  No HRT.  No PMB.  No pelvic pain.  No pain with IC.  Very fit, goes to the [employer] ([employer]) regularly.  Breasts wnl.  Mictions/BMs wnl.  Past medical history,surgical history, family history and social history were all reviewed and documented in the EPIC chart.  Gynecologic History Patient's last menstrual period was 07/09/2015. Contraception: post menopausal status Last Pap: 2016. Results were: normal Last mammogram: 2016. Results were: normal No previous BD [place] 5 yrs ago  Obstetric History OB History  Gravida Para Term Preterm AB Living  0 0 0 0 0 0  SAB TAB Ectopic Multiple Live Births  0 0 0 0 0         ROS: A ROS was performed and pertinent positives and negatives are included in the history.  GENERAL: No fevers or chills. HEENT: No change in vision, no earache, sore throat or sinus congestion. NECK: No pain or stiffness. CARDIOVASCULAR: No chest pain or pressure. No palpitations. PULMONARY: No shortness of breath, cough or wheeze. GASTROINTESTINAL: No abdominal pain, nausea, vomiting or diarrhea, melena or bright red blood per rectum. GENITOURINARY: No urinary frequency, urgency, hesitancy or dysuria. MUSCULOSKELETAL: No joint or muscle pain, no back pain, no recent trauma. DERMATOLOGIC: No rash, no itching, no lesions. ENDOCRINE: No polyuria, polydipsia, no heat or cold intolerance. No recent change in weight. HEMATOLOGICAL: No anemia or easy bruising or bleeding. NEUROLOGIC: No headache, seizures, numbness, tingling or weakness. PSYCHIATRIC: No depression, no loss of interest in normal activity or change in sleep pattern.     Exam:   BP 102/70   Ht 5' 5.5" (1.664 m)   Wt 133 lb (60.3 kg)   LMP 07/09/2015   BMI 21.80 kg/m   Body mass index is 21.8 kg/m.  General appearance : Well developed well nourished female. No  acute distress HEENT: Eyes: no retinal hemorrhage or exudates,  Neck supple, trachea midline, no carotid bruits, no thyroidmegaly Lungs: Clear to auscultation, no rhonchi or wheezes, or rib retractions  Heart: Regular rate and rhythm, no murmurs or gallops Breast:Examined in sitting and supine position were symmetrical in appearance, no palpable masses or tenderness,  no skin retraction, no nipple inversion, no nipple discharge, no skin discoloration, no axillary or supraclavicular lymphadenopathy Abdomen: no palpable masses or tenderness, no rebound or guarding Extremities: no edema or skin discoloration or tenderness  Pelvic: Vulva normal  Bartholin, Urethra, Skene Glands: Within normal limits             Vagina: No gross lesions or discharge  Cervix: No gross lesions or discharge.  Pap/HPV done.  Uterus  AV, normal size, shape and consistency, non-tender and mobile  Adnexa  Without masses or tenderness  Anus, small non-thrombosed hemorrhoid,  perineum  normal   Assessment/Plan:  56 y.o. female for annual exam   1. Encounter for routine gynecological examination with Papanicolaou smear of cervix Normal gyn exam.  Pap/HPV done.  Breasts wnl.  Will schedule Screening [facility] at [facility] - TSH - Lipid panel - Vitamin D 1,25 dihydroxy - Hemoglobin A1c  2. Menopause present No HRT.  No PMB.  Vit D supplements, Ca++ in nutrition.  Continue weight bearing physical activity.  Schedule Bone Density here.  Princess Bruins MD, 8:14 AM 08/04/2017

## 2017-08-04 NOTE — Patient Instructions (Signed)
1. Encounter for routine gynecological examination with Papanicolaou smear of cervix Normal gyn exam.  Pap/HPV done.  Breasts wnl.  Will schedule Screening Mammo at the [facility] ([facility]) - TSH - Lipid panel - Vitamin D 1,25 dihydroxy - Hemoglobin A1c  2. Menopause present No HRT.  No PMB.  Vit D supplements, Ca++ in nutrition.  Continue weight bearing physical activity.  Schedule Bone Density here.  Caitlin Sanchez, it was a pleasure to see you today!  Congrats on your fitness!  I will inform you of your results as soon as available.   Health Maintenance for Postmenopausal Women Menopause is a normal process in which your reproductive ability comes to an end. This process happens gradually over a span of months to years, usually between the ages of 45 and 9. Menopause is complete when you have missed 12 consecutive menstrual periods. It is important to talk with your health care provider about some of the most common conditions that affect postmenopausal women, such as heart disease, cancer, and bone loss (osteoporosis). Adopting a healthy lifestyle and getting preventive care can help to promote your health and wellness. Those actions can also lower your chances of developing some of these common conditions. What should I know about menopause? During menopause, you may experience a number of symptoms, such as:  Moderate-to-severe hot flashes.  Night sweats.  Decrease in sex drive.  Mood swings.  Headaches.  Tiredness.  Irritability.  Memory problems.  Insomnia.  Choosing to treat or not to treat menopausal changes is an individual decision that you make with your health care provider. What should I know about hormone replacement therapy and supplements? Hormone therapy products are effective for treating symptoms that are associated with menopause, such as hot flashes and night sweats. Hormone replacement carries certain risks, especially as you become older. If  you are thinking about using estrogen or estrogen with progestin treatments, discuss the benefits and risks with your health care provider. What should I know about heart disease and stroke? Heart disease, heart attack, and stroke become more likely as you age. This may be due, in part, to the hormonal changes that your body experiences during menopause. These can affect how your body processes dietary fats, triglycerides, and cholesterol. Heart attack and stroke are both medical emergencies. There are many things that you can do to help prevent heart disease and stroke:  Have your blood pressure checked at least every 1-2 years. High blood pressure causes heart disease and increases the risk of stroke.  If you are 64-107 years old, ask your health care provider if you should take aspirin to prevent a heart attack or a stroke.  Do not use any tobacco products, including cigarettes, chewing tobacco, or electronic cigarettes. If you need help quitting, ask your health care provider.  It is important to eat a healthy diet and maintain a healthy weight. ? Be sure to include plenty of vegetables, fruits, low-fat dairy products, and lean protein. ? Avoid eating foods that are high in solid fats, added sugars, or salt (sodium).  Get regular exercise. This is one of the most important things that you can do for your health. ? Try to exercise for at least 150 minutes each week. The type of exercise that you do should increase your heart rate and make you sweat. This is known as moderate-intensity exercise. ? Try to do strengthening exercises at least twice each week. Do these in addition to the moderate-intensity exercise.  Know your numbers.Ask your health care provider to check your cholesterol and your blood glucose. Continue to have your blood tested as directed by your health care provider.  What should I know about cancer screening? There are several types of cancer. Take the following steps to  reduce your risk and to catch any cancer development as early as possible. Breast Cancer  Practice breast self-awareness. ? This means understanding how your breasts normally appear and feel. ? It also means doing regular breast self-exams. Let your health care provider know about any changes, no matter how small.  If you are 28 or older, have a clinician do a breast exam (clinical breast exam or CBE) every year. Depending on your age, family history, and medical history, it may be recommended that you also have a yearly breast X-ray (mammogram).  If you have a family history of breast cancer, talk with your health care provider about genetic screening.  If you are at high risk for breast cancer, talk with your health care provider about having an MRI and a mammogram every year.  Breast cancer (BRCA) gene test is recommended for women who have family members with BRCA-related cancers. Results of the assessment will determine the need for genetic counseling and BRCA1 and for BRCA2 testing. BRCA-related cancers include these types: ? Breast. This occurs in males or females. ? Ovarian. ? Tubal. This may also be called fallopian tube cancer. ? Cancer of the abdominal or pelvic lining (peritoneal cancer). ? Prostate. ? Pancreatic.  Cervical, Uterine, and Ovarian Cancer Your health care provider may recommend that you be screened regularly for cancer of the pelvic organs. These include your ovaries, uterus, and vagina. This screening involves a pelvic exam, which includes checking for microscopic changes to the surface of your cervix (Pap test).  For women ages 21-65, health care providers may recommend a pelvic exam and a Pap test every three years. For women ages 24-65, they may recommend the Pap test and pelvic exam, combined with testing for human papilloma virus (HPV), every five years. Some types of HPV increase your risk of cervical cancer. Testing for HPV may also be done on women of any  age who have unclear Pap test results.  Other health care providers may not recommend any screening for nonpregnant women who are considered low risk for pelvic cancer and have no symptoms. Ask your health care provider if a screening pelvic exam is right for you.  If you have had past treatment for cervical cancer or a condition that could lead to cancer, you need Pap tests and screening for cancer for at least 20 years after your treatment. If Pap tests have been discontinued for you, your risk factors (such as having a new sexual partner) need to be reassessed to determine if you should start having screenings again. Some women have medical problems that increase the chance of getting cervical cancer. In these cases, your health care provider may recommend that you have screening and Pap tests more often.  If you have a family history of uterine cancer or ovarian cancer, talk with your health care provider about genetic screening.  If you have vaginal bleeding after reaching menopause, tell your health care provider.  There are currently no reliable tests available to screen for ovarian cancer.  Lung Cancer Lung cancer screening is recommended for adults 73-60 years old who are at high risk for lung cancer because of a history of smoking. A yearly low-dose CT scan of the  lungs is recommended if you:  Currently smoke.  Have a history of at least 30 pack-years of smoking and you currently smoke or have quit within the past 15 years. A pack-year is smoking an average of one pack of cigarettes per day for one year.  Yearly screening should:  Continue until it has been 15 years since you quit.  Stop if you develop a health problem that would prevent you from having lung cancer treatment.  Colorectal Cancer  This type of cancer can be detected and can often be prevented.  Routine colorectal cancer screening usually begins at age 38 and continues through age 14.  If you have risk factors  for colon cancer, your health care provider may recommend that you be screened at an earlier age.  If you have a family history of colorectal cancer, talk with your health care provider about genetic screening.  Your health care provider may also recommend using home test kits to check for hidden blood in your stool.  A small camera at the end of a tube can be used to examine your colon directly (sigmoidoscopy or colonoscopy). This is done to check for the earliest forms of colorectal cancer.  Direct examination of the colon should be repeated every 5-10 years until age 39. However, if early forms of precancerous polyps or small growths are found or if you have a family history or genetic risk for colorectal cancer, you may need to be screened more often.  Skin Cancer  Check your skin from head to toe regularly.  Monitor any moles. Be sure to tell your health care provider: ? About any new moles or changes in moles, especially if there is a change in a mole's shape or color. ? If you have a mole that is larger than the size of a pencil eraser.  If any of your family members has a history of skin cancer, especially at a young age, talk with your health care provider about genetic screening.  Always use sunscreen. Apply sunscreen liberally and repeatedly throughout the day.  Whenever you are outside, protect yourself by wearing long sleeves, pants, a wide-brimmed hat, and sunglasses.  What should I know about osteoporosis? Osteoporosis is a condition in which bone destruction happens more quickly than new bone creation. After menopause, you may be at an increased risk for osteoporosis. To help prevent osteoporosis or the bone fractures that can happen because of osteoporosis, the following is recommended:  If you are 26-79 years old, get at least 1,000 mg of calcium and at least 600 mg of vitamin D per day.  If you are older than age 35 but younger than age 60, get at least 1,200 mg of  calcium and at least 600 mg of vitamin D per day.  If you are older than age 40, get at least 1,200 mg of calcium and at least 800 mg of vitamin D per day.  Smoking and excessive alcohol intake increase the risk of osteoporosis. Eat foods that are rich in calcium and vitamin D, and do weight-bearing exercises several times each week as directed by your health care provider. What should I know about how menopause affects my mental health? Depression may occur at any age, but it is more common as you become older. Common symptoms of depression include:  Low or sad mood.  Changes in sleep patterns.  Changes in appetite or eating patterns.  Feeling an overall lack of motivation or enjoyment of activities that you previously  enjoyed.  Frequent crying spells.  Talk with your health care provider if you think that you are experiencing depression. What should I know about immunizations? It is important that you get and maintain your immunizations. These include:  Tetanus, diphtheria, and pertussis (Tdap) booster vaccine.  Influenza every year before the flu season begins.  Pneumonia vaccine.  Shingles vaccine.  Your health care provider may also recommend other immunizations. This information is not intended to replace advice given to you by your health care provider. Make sure you discuss any questions you have with your health care provider. Document Released: 01/30/2006 Document Revised: 06/27/2016 Document Reviewed: 09/11/2015 Elsevier Interactive Patient Education  2018 [employer].

## 2017-08-05 LAB — HEMOGLOBIN A1C
HEMOGLOBIN A1C: 5.1 % (ref <5.7)
MEAN PLASMA GLUCOSE: 100 mg/dL

## 2017-08-06 LAB — PAP, TP IMAGING W/ HPV RNA, RFLX HPV TYPE 16,18/45: HPV mRNA, High Risk: NOT DETECTED

## 2017-08-07 ENCOUNTER — Encounter: Payer: Self-pay | Admitting: *Deleted

## 2017-08-08 LAB — VITAMIN D 1,25 DIHYDROXY
Vitamin D 1, 25 (OH)2 Total: 43 pg/mL (ref 18–72)
Vitamin D2 1, 25 (OH)2: <8 pg/mL
Vitamin D3 1, 25 (OH)2: 43 pg/mL

## 2017-08-11 ENCOUNTER — Other Ambulatory Visit: Payer: Self-pay | Admitting: Gynecology

## 2017-08-11 ENCOUNTER — Encounter: Payer: Self-pay | Admitting: Gynecology

## 2017-08-11 ENCOUNTER — Ambulatory Visit (INDEPENDENT_AMBULATORY_CARE_PROVIDER_SITE_OTHER): Payer: Managed Care, Other (non HMO)

## 2017-08-11 DIAGNOSIS — M85851 Other specified disorders of bone density and structure, right thigh: Secondary | ICD-10-CM

## 2017-08-11 DIAGNOSIS — Z13820 Encounter for screening for osteoporosis: Secondary | ICD-10-CM

## 2018-10-18 ENCOUNTER — Encounter: Payer: Self-pay | Admitting: Internal Medicine

## 2019-02-28 ENCOUNTER — Ambulatory Visit
Admission: RE | Admit: 2019-02-28 | Discharge: 2019-02-28 | Disposition: A | Discharge status: Home / Self Care | Payer: Managed Care, Other (non HMO) | Source: Ambulatory Visit

## 2019-02-28 ENCOUNTER — Other Ambulatory Visit: Payer: Self-pay | Admitting: Internal Medicine

## 2019-02-28 DIAGNOSIS — Z1231 Encounter for screening mammogram for malignant neoplasm of breast: Secondary | ICD-10-CM

## 2019-09-20 ENCOUNTER — Encounter: Payer: Self-pay | Admitting: Gynecology

## 2020-02-17 ENCOUNTER — Other Ambulatory Visit: Payer: Self-pay | Admitting: Internal Medicine

## 2020-02-17 DIAGNOSIS — E785 Hyperlipidemia, unspecified: Secondary | ICD-10-CM

## 2020-02-27 ENCOUNTER — Ambulatory Visit
Admission: RE | Admit: 2020-02-27 | Discharge: 2020-02-27 | Disposition: A | Discharge status: Home / Self Care | Payer: No Typology Code available for payment source | Source: Ambulatory Visit | Attending: Internal Medicine | Admitting: Internal Medicine

## 2020-02-27 DIAGNOSIS — E785 Hyperlipidemia, unspecified: Secondary | ICD-10-CM

## 2021-02-18 ENCOUNTER — Other Ambulatory Visit: Payer: Self-pay | Admitting: Internal Medicine

## 2021-02-18 ENCOUNTER — Other Ambulatory Visit: Payer: Self-pay

## 2021-02-18 DIAGNOSIS — F17200 Nicotine dependence, unspecified, uncomplicated: Secondary | ICD-10-CM

## 2021-02-26 ENCOUNTER — Other Ambulatory Visit: Payer: Self-pay | Admitting: Internal Medicine

## 2021-02-26 DIAGNOSIS — Z1231 Encounter for screening mammogram for malignant neoplasm of breast: Secondary | ICD-10-CM

## 2021-03-08 ENCOUNTER — Ambulatory Visit: Payer: Managed Care, Other (non HMO)

## 2021-03-22 ENCOUNTER — Other Ambulatory Visit: Payer: Self-pay

## 2021-03-22 ENCOUNTER — Ambulatory Visit
Admission: RE | Admit: 2021-03-22 | Discharge: 2021-03-22 | Disposition: A | Discharge status: Home / Self Care | Payer: Managed Care, Other (non HMO) | Source: Ambulatory Visit | Attending: Internal Medicine | Admitting: Internal Medicine

## 2021-03-22 DIAGNOSIS — F17200 Nicotine dependence, unspecified, uncomplicated: Secondary | ICD-10-CM

## 2021-04-10 ENCOUNTER — Other Ambulatory Visit: Payer: Self-pay

## 2021-04-10 ENCOUNTER — Ambulatory Visit
Admission: RE | Admit: 2021-04-10 | Discharge: 2021-04-10 | Disposition: A | Discharge status: Home / Self Care | Payer: 59 | Source: Ambulatory Visit

## 2021-04-10 DIAGNOSIS — Z1231 Encounter for screening mammogram for malignant neoplasm of breast: Secondary | ICD-10-CM

## 2021-10-07 ENCOUNTER — Encounter: Payer: Self-pay | Admitting: Internal Medicine

## 2021-11-19 ENCOUNTER — Ambulatory Visit (AMBULATORY_SURGERY_CENTER): Payer: 59

## 2021-11-19 ENCOUNTER — Other Ambulatory Visit: Payer: Self-pay

## 2021-11-19 VITALS — Ht 65.0 in | Wt 140.0 lb

## 2021-11-19 DIAGNOSIS — Z860100 Personal history of colon polyps, unspecified: Secondary | ICD-10-CM

## 2021-11-19 MED ORDER — SUTAB 1479-225-188 MG PO TABS: 1.0000 | ORAL_TABLET | ORAL | 0 refills | Status: DC

## 2021-11-19 NOTE — Progress Notes (Signed)
Pre visit completed via phone call; Patient verified name, DOB, and address; No egg or soy allergy known to patient  No issues known to pt with past sedation with any surgeries or procedures Patient denies ever being told they had issues or difficulty with intubation  No FH of Malignant Hyperthermia Pt is not on diet pills Pt is not on home 02  Pt is not on blood thinners  Pt denies issues with constipation at this time;  No A fib or A flutter Pt is fully vaccinated for Covid x 2; Coupon given to pt in PV today, Code to Pharmacy and NO PA's for preps discussed with pt in PV today  Discussed with pt there will be an out-of-pocket cost for prep and that varies from $0 to 70 +  dollars - pt verbalized understanding  Due to the COVID-19 pandemic we are asking patients to follow certain guidelines in PV and the [place]   Pt aware of COVID protocols and [facility] guidelines

## 2021-11-25 ENCOUNTER — Encounter: Payer: Self-pay | Admitting: Internal Medicine

## 2021-11-26 NOTE — Telephone Encounter (Signed)
Patient called in to follow up about previous message

## 2021-11-29 ENCOUNTER — Encounter: Payer: Self-pay | Admitting: Internal Medicine

## 2021-11-29 ENCOUNTER — Other Ambulatory Visit: Payer: Self-pay

## 2021-11-29 ENCOUNTER — Ambulatory Visit (AMBULATORY_SURGERY_CENTER): Payer: 59 | Admitting: Internal Medicine

## 2021-11-29 VITALS — BP 110/66 | HR 73 | Temp 97.5°F | Resp 12 | Ht 65.0 in | Wt 140.0 lb

## 2021-11-29 DIAGNOSIS — D123 Benign neoplasm of transverse colon: Secondary | ICD-10-CM | POA: Diagnosis not present

## 2021-11-29 DIAGNOSIS — Z860100 Personal history of colon polyps, unspecified: Secondary | ICD-10-CM

## 2021-11-29 DIAGNOSIS — D122 Benign neoplasm of ascending colon: Secondary | ICD-10-CM

## 2021-11-29 HISTORY — PX: COLONOSCOPY W/ POLYPECTOMY: SHX1380

## 2021-11-29 MED ORDER — SODIUM CHLORIDE 0.9 % IV SOLN: 500.0000 mL | Freq: Once | INTRAVENOUS | Status: DC

## 2021-11-29 NOTE — Patient Instructions (Addendum)
I found and removed 2 tiny polyps. I will let you know pathology results and when to have another routine colonoscopy by mail and/or My Chart.  I appreciate the opportunity to care for you. Gatha Mayer, MD, FACG   YOU HAD AN ENDOSCOPIC PROCEDURE TODAY AT [facility]:   Refer to the procedure report that was given to you for any specific questions about what was found during the examination.  If the procedure report does not answer your questions, please call your gastroenterologist to clarify.  If you requested that your care partner not be given the details of your procedure findings, then the procedure report has been included in a sealed envelope for you to review at your convenience later.  YOU SHOULD EXPECT: Some feelings of bloating in the abdomen. Passage of more gas than usual.  Walking can help get rid of the air that was put into your GI tract during the procedure and reduce the bloating. If you had a lower endoscopy (such as a colonoscopy or flexible sigmoidoscopy) you may notice spotting of blood in your stool or on the toilet paper. If you underwent a bowel prep for your procedure, you may not have a normal bowel movement for a few days.  Please Note:  You might notice some irritation and congestion in your nose or some drainage.  This is from the oxygen used during your procedure.  There is no need for concern and it should clear up in a day or so.  SYMPTOMS TO REPORT IMMEDIATELY:  Following lower endoscopy (colonoscopy or flexible sigmoidoscopy):  Excessive amounts of blood in the stool  Significant tenderness or worsening of abdominal pains  Swelling of the abdomen that is new, acute  Fever of 100F or higher  For urgent or emergent issues, a gastroenterologist can be reached at any hour by calling [phone]. Do not use MyChart messaging for urgent concerns.    DIET:  We do recommend a small meal at first, but then you may proceed to your regular  diet.  Drink plenty of fluids but you should avoid alcoholic beverages for 24 hours.  ACTIVITY:  You should plan to take it easy for the rest of today and you should NOT DRIVE or use heavy machinery until tomorrow (because of the sedation medicines used during the test).    FOLLOW UP: Our staff will call the number listed on your records 48-72 hours following your procedure to check on you and address any questions or concerns that you may have regarding the information given to you following your procedure. If we do not reach you, we will leave a message.  We will attempt to reach you two times.  During this call, we will ask if you have developed any symptoms of COVID 19. If you develop any symptoms (ie: fever, flu-like symptoms, shortness of breath, cough etc.) before then, please call [phone].  If you test positive for Covid 19 in the 2 weeks post procedure, please call and report this information to [place].    If any biopsies were taken you will be contacted by phone or by letter within the next 1-3 weeks.  Please call us at [phone] if you have not heard about the biopsies in 3 weeks.    SIGNATURES/CONFIDENTIALITY: You and/or your care partner have signed paperwork which will be entered into your electronic medical record.  These signatures attest to the fact that that the information above on your After Visit Summary  has been reviewed and is understood.  Full responsibility of the confidentiality of this discharge information lies with you and/or your care-partner.  

## 2021-11-29 NOTE — Op Note (Signed)
[facility] Patient Name: Caitlin Sanchez Procedure Date: 11/29/2021 8:03 AM MRN: [id number] Endoscopist: Gatha Mayer , MD Age: 60 Referring MD:  Date of Birth: May 25, 1961 Gender: Female Account #: [id number] Procedure:                Colonoscopy Indications:              Surveillance: Personal history of adenomatous                            polyps on last colonoscopy > 5 years ago, Last                            colonoscopy: 2014 Medicines:                Propofol per Anesthesia, Monitored Anesthesia Care Procedure:                Pre-Anesthesia Assessment:                           - Prior to the procedure, a History and Physical                            was performed, and patient medications and                            allergies were reviewed. The patient's tolerance of                            previous anesthesia was also reviewed. The risks                            and benefits of the procedure and the sedation                            options and risks were discussed with the patient.                            All questions were answered, and informed consent                            was obtained. Prior Anticoagulants: The patient has                            taken no previous anticoagulant or antiplatelet                            agents. ASA Grade Assessment: II - A patient with                            mild systemic disease. After reviewing the risks                            and benefits, the patient was deemed in  satisfactory condition to undergo the procedure.                           After obtaining informed consent, the colonoscope                            was passed under direct vision. Throughout the                            procedure, the patient's blood pressure, pulse, and                            oxygen saturations were monitored continuously. The                            PCF-HQ190L Colonoscope  was introduced through the                            anus and advanced to the the cecum, identified by                            appendiceal orifice and ileocecal valve. The                            colonoscopy was somewhat difficult due to                            significant looping. Successful completion of the                            procedure was aided by applying abdominal pressure.                            The patient tolerated the procedure well. The                            quality of the bowel preparation was good. The                            ileocecal valve, appendiceal orifice, and rectum                            were photographed. The bowel preparation used was                            SuTab via split dose instruction. Scope In: 8:08:39 AM Scope Out: 8:31:05 AM Scope Withdrawal Time: 0 hours 16 minutes 12 seconds  Total Procedure Duration: 0 hours 22 minutes 26 seconds  Findings:                 The perianal and digital rectal examinations were                            normal.  Two sessile polyps were found in the transverse                            colon and ascending colon. The polyps were                            diminutive in size. These polyps were removed with                            a cold snare. Resection and retrieval were                            complete. Verification of patient identification                            for the specimen was done. Estimated blood loss was                            minimal.                           The exam was otherwise without abnormality on                            direct and retroflexion views. Complications:            No immediate complications. Estimated Blood Loss:     Estimated blood loss was minimal. Impression:               - Two diminutive polyps in the transverse colon and                            in the ascending colon, removed with a cold snare.                             Resected and retrieved.                           - The examination was otherwise normal on direct                            and retroflexion views.                           - Personal history of colonic polyp 8 mm adenoma                            2014. Recommendation:           - Patient has a contact number available for                            emergencies. The signs and symptoms of potential                            delayed complications were discussed  with the                            patient. Return to normal activities tomorrow.                            Written discharge instructions were provided to the                            patient.                           - Resume previous diet.                           - Continue present medications.                           - Repeat colonoscopy is recommended for                            surveillance. The colonoscopy date will be                            determined after pathology results from today's                            exam become available for review. Gatha Mayer, MD 11/29/2021 8:38:02 AM This report has been signed electronically.

## 2021-11-29 NOTE — Progress Notes (Signed)
[facility] History and Physical   Primary Care Physician:  Ginger Organ., MD   Reason for Procedure:   Hx colon polypo  Plan:    colonoscopy     HPI: Caitlin Sanchez is a 60 y.o. female w/ hx 8 mm adenoma removed from colon in 2019.   Past Medical History:  Diagnosis Date   Arthritis    Osteopenia 07/2017   T score -1.4 FRAX 6%/0.8%   Personal history of colonic adenoma 06/30/2013    Past Surgical History:  Procedure Laterality Date   COLONOSCOPY  2014   CG-MAC-supre (adeq)-sessile polyp/TA   POLYPECTOMY  2014   sessile polyp and TA   WISDOM TOOTH EXTRACTION      Prior to Admission medications   Medication Sig Start Date End Date Taking? Authorizing Provider  DULoxetine (CYMBALTA) 60 MG capsule Take 60 mg by mouth daily. 08/28/21  Yes [provider]  ondansetron (ZOFRAN-ODT) 8 MG disintegrating tablet Take 1 tablet by mouth every 6 (six) hours as needed.    [provider]    Current Outpatient Medications  Medication Sig Dispense Refill   DULoxetine (CYMBALTA) 60 MG capsule Take 60 mg by mouth daily.     ondansetron (ZOFRAN-ODT) 8 MG disintegrating tablet Take 1 tablet by mouth every 6 (six) hours as needed.     Current Facility-Administered Medications  Medication Dose Route Frequency Provider Last Rate Last Admin   0.9 %  sodium chloride infusion  500 mL Intravenous Once Gatha Mayer, MD        Allergies as of 11/29/2021 - Review Complete 11/29/2021  Allergen Reaction Noted   Aspirin Swelling 04/15/2013    Family History  Problem Relation Age of Onset   Cancer Mother 38       PANCREATIC    Hypertension Father    Heart attack Father    Cancer Paternal Aunt        OVARIAN    Colon cancer Neg Hx     Social History   Socioeconomic History   Marital status: Married    Spouse name: Not on file   Number of children: Not on file   Years of education: Not on file   Highest education level: Not on file   Occupational History   Not on file  Tobacco Use   Smoking status: Every Day    Packs/day: 0.50    Types: Cigarettes   Smokeless tobacco: Never  Vaping Use   Vaping Use: Never used  Substance and Sexual Activity   Alcohol use: Not Currently    Alcohol/week: 5.0 standard drinks    Types: 5 Standard drinks or equivalent per week    Comment: 2 glasses of wine a night    Drug use: No   Sexual activity: Yes    Partners: Male    Comment: 1st INTERCOURSE- 44, PARTNERS- 5, MARRIED- 47 YRS   Other Topics Concern   Not on file  Social History Narrative   Not on file   Social Determinants of Health   Financial Resource Strain: Not on file  Food Insecurity: Not on file  Transportation Needs: Not on file  Physical Activity: Not on file  Stress: Not on file  Social Connections: Not on file  Intimate Partner Violence: Not on file    Review of Systems:  All other review of systems negative except as mentioned in the HPI.  Physical Exam: Vital signs BP 130/88   Pulse 85   Temp Marland Kitchen)  97.5 F (36.4 C)   Ht _0  (1.651 m)   Wt 140 lb (63.5 kg)   LMP 07/09/2015   SpO2 99%   BMI 23.30 kg/m   General:   Alert,  Well-developed, well-nourished, pleasant and cooperative in NAD Lungs:  Clear throughout to auscultation.   Heart:  Regular rate and rhythm; no murmurs, clicks, rubs,  or gallops. Abdomen:  Soft, nontender and nondistended. Normal bowel sounds.   Neuro/Psych:  Alert and cooperative. Normal mood and affect. A and O x 3   _1  E. Carlean Purl, MD, [facility] [phone] (pager) 11/29/2021 8:01 AM@

## 2021-11-29 NOTE — Progress Notes (Signed)
Called to room to assist during endoscopic procedure.  Patient ID and intended procedure confirmed with present staff. Received instructions for my participation in the procedure from the performing physician.  

## 2021-11-29 NOTE — Progress Notes (Signed)
Pt's states no medical or surgical changes since previsit or office visit. VS by CW. 

## 2021-11-29 NOTE — Progress Notes (Signed)
PT taken to PACU. Monitors in place. VSS. Report given to RN. 

## 2021-12-03 ENCOUNTER — Telehealth: Payer: Self-pay

## 2021-12-03 NOTE — Telephone Encounter (Signed)
Telephone call to patient, VM obtained and message left that RN was calling to check on her after procedure and will try and call back later today. SChaplin, RN,BSN

## 2021-12-06 ENCOUNTER — Encounter: Payer: Self-pay | Admitting: Internal Medicine

## 2022-03-18 ENCOUNTER — Other Ambulatory Visit: Payer: Self-pay | Admitting: Internal Medicine

## 2022-03-18 DIAGNOSIS — F17200 Nicotine dependence, unspecified, uncomplicated: Secondary | ICD-10-CM

## 2022-04-14 ENCOUNTER — Other Ambulatory Visit (HOSPITAL_COMMUNITY): Payer: Self-pay | Admitting: Radiology

## 2022-04-14 DIAGNOSIS — J432 Centrilobular emphysema: Secondary | ICD-10-CM

## 2022-04-18 ENCOUNTER — Ambulatory Visit (HOSPITAL_COMMUNITY)
Admission: RE | Admit: 2022-04-18 | Discharge: 2022-04-18 | Disposition: A | Discharge status: Home / Self Care | Payer: Managed Care, Other (non HMO) | Source: Ambulatory Visit | Attending: Internal Medicine | Admitting: Internal Medicine

## 2022-04-18 DIAGNOSIS — J432 Centrilobular emphysema: Secondary | ICD-10-CM | POA: Diagnosis present

## 2022-04-18 LAB — PULMONARY FUNCTION TEST
DL/VA % pred: 109 %
DL/VA: 4.58 ml/min/mmHg/L
DLCO unc % pred: 106 %
DLCO unc: 22.23 ml/min/mmHg
FEF 25-75 Post: 3.19 L/sec
FEF 25-75 Pre: 2.8 L/sec
FEF2575-%Change-Post: 14 %
FEF2575-%Pred-Post: 132 %
FEF2575-%Pred-Pre: 116 %
FEV1-%Change-Post: 3 %
FEV1-%Pred-Post: 110 %
FEV1-%Pred-Pre: 106 %
FEV1-Post: 2.93 L
FEV1-Pre: 2.83 L
FEV1FVC-%Change-Post: 6 %
FEV1FVC-%Pred-Pre: 101 %
FEV6-%Change-Post: -1 %
FEV6-%Pred-Post: 104 %
FEV6-%Pred-Pre: 106 %
FEV6-Post: 3.47 L
FEV6-Pre: 3.53 L
FEV6FVC-%Change-Post: 0 %
FEV6FVC-%Pred-Post: 103 %
FEV6FVC-%Pred-Pre: 103 %
FVC-%Change-Post: -3 %
FVC-%Pred-Post: 101 %
FVC-%Pred-Pre: 104 %
FVC-Post: 3.47 L
FVC-Pre: 3.57 L
Post FEV1/FVC ratio: 85 %
Post FEV6/FVC ratio: 100 %
Pre FEV1/FVC ratio: 79 %
Pre FEV6/FVC Ratio: 100 %
RV % pred: 107 %
RV: 2.19 L
TLC % pred: 107 %
TLC: 5.62 L

## 2022-04-18 MED ORDER — ALBUTEROL SULFATE (2.5 MG/3ML) 0.083% IN NEBU
2.5000 mg | INHALATION_SOLUTION | Freq: Once | RESPIRATORY_TRACT | Status: AC
  Administered 2022-04-18: 2.5 mg via RESPIRATORY_TRACT

## 2022-04-23 ENCOUNTER — Ambulatory Visit
Admission: RE | Admit: 2022-04-23 | Discharge: 2022-04-23 | Disposition: A | Discharge status: Home / Self Care | Payer: Managed Care, Other (non HMO) | Source: Ambulatory Visit | Attending: Internal Medicine | Admitting: Internal Medicine

## 2022-04-23 DIAGNOSIS — F17200 Nicotine dependence, unspecified, uncomplicated: Secondary | ICD-10-CM

## 2023-02-02 IMAGING — CT CT CHEST LUNG CANCER SCREENING LOW DOSE W/O CM
2 of 5 series · 15 of 40 positions shown, 18 images · non-contrast
Comparison: Low-dose lung cancer screening CT chest dated
03/22/2021

CLINICAL DATA: 6-year-old female current smoker, with 21 pack-year
history of smoking, for follow-up lung cancer screening



[Series 4: lung 1.00 br44 cor · coronal · 0.57mm/px · 3 of 296 slices shown]
[im 60/296  lung]
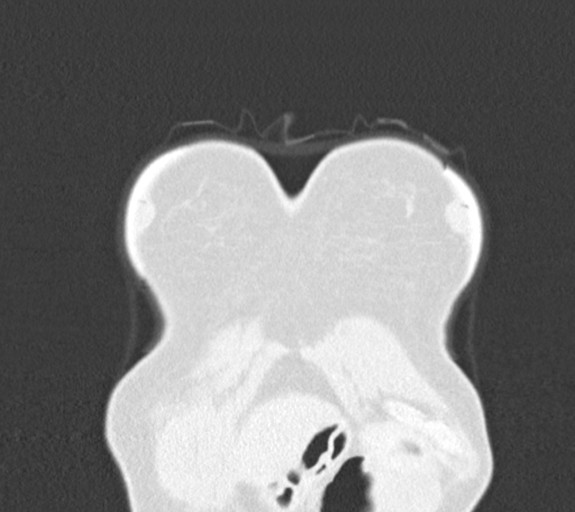
[im 119/296  lung]
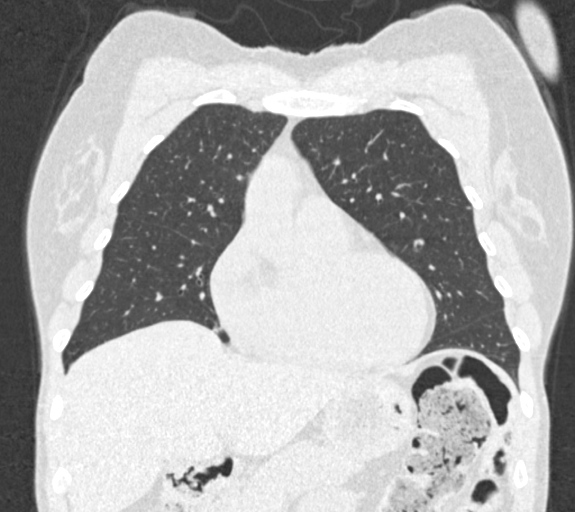
[im 178/296  lung]
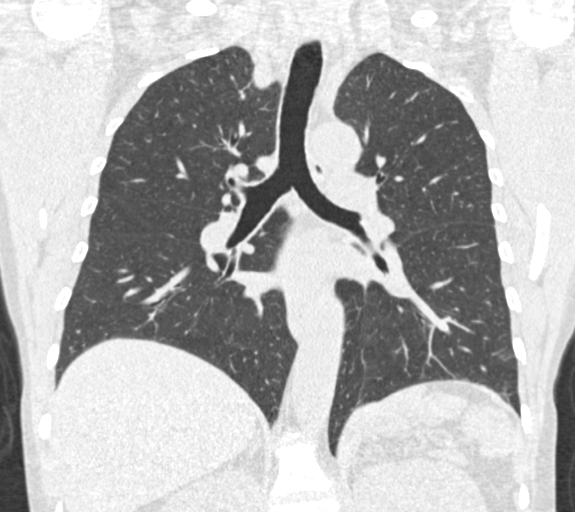

[Series 9: lung 1.00 br60 axial · axial · 0.64mm/px · z∈[-1140,-875]mm · 12 of 293 slices shown, 15 images]
[im 14/293  mediastinal]
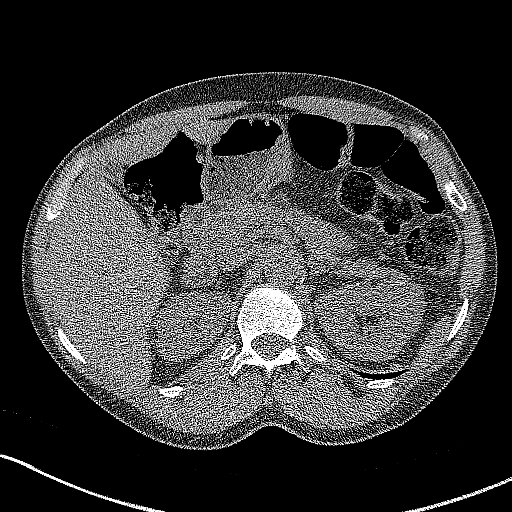
[im 14/293  lung]
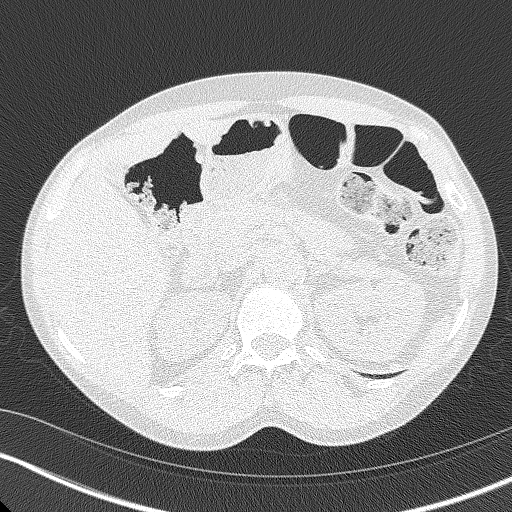
[im 40/293  lung]
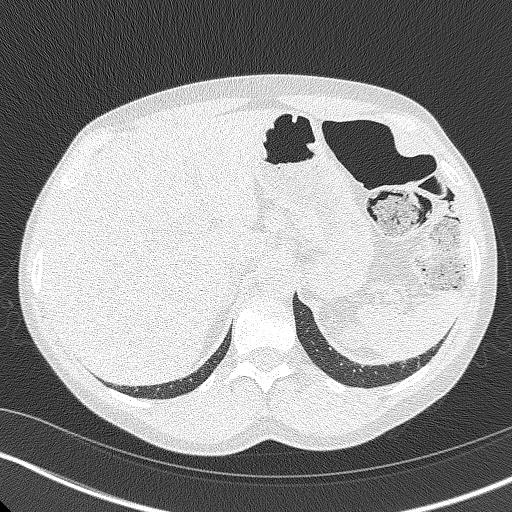
[im 67/293  lung]
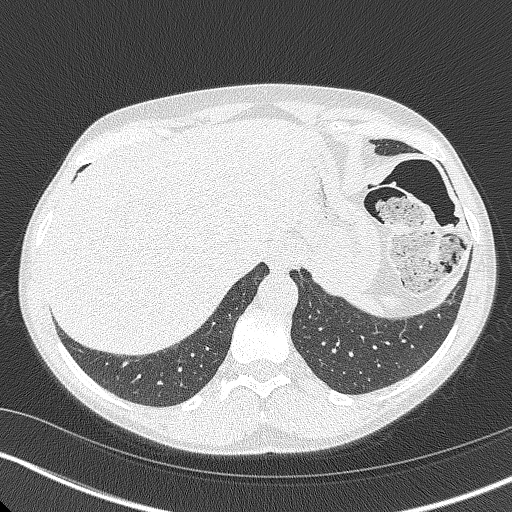
[im 93/293  lung]
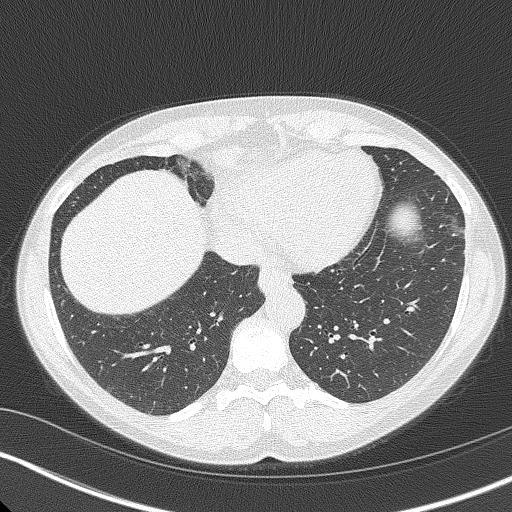
[im 107/293  mediastinal]
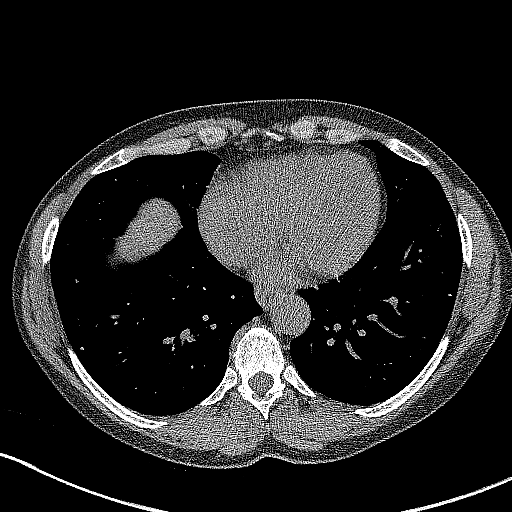
[im 107/293  lung]
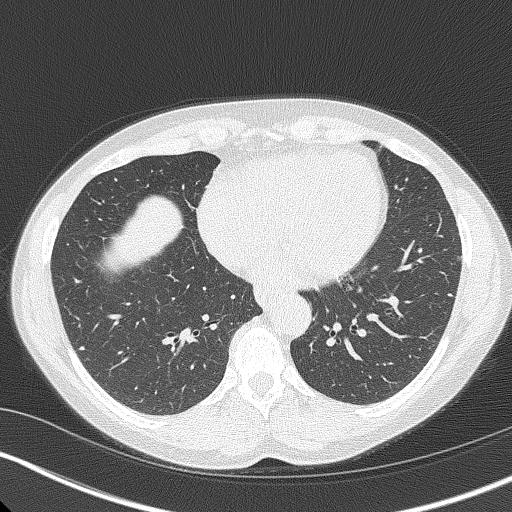
[im 133/293  lung]
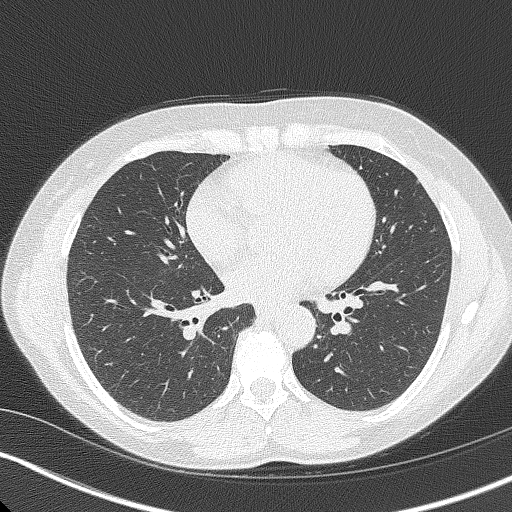
[im 160/293  lung]
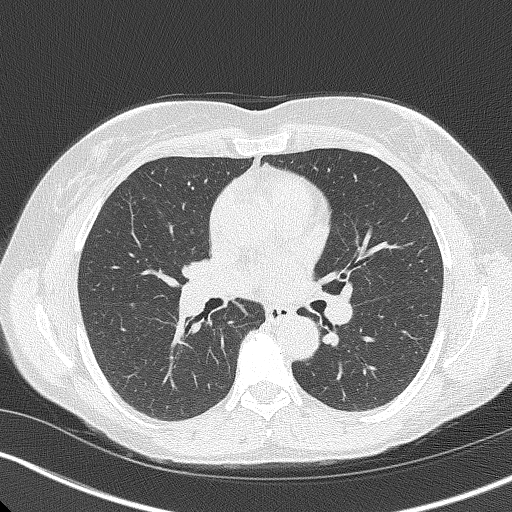
[im 186/293  lung]
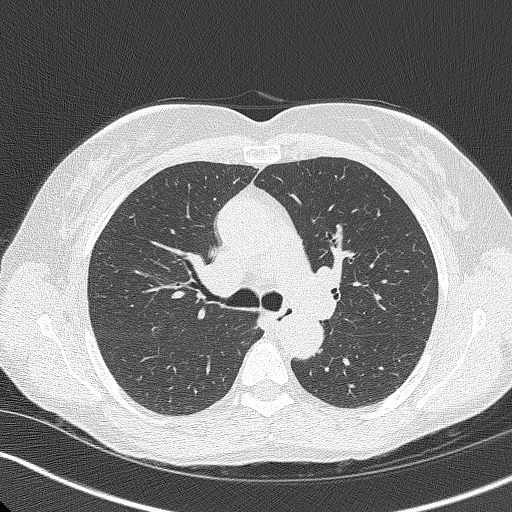
[im 200/293  mediastinal]
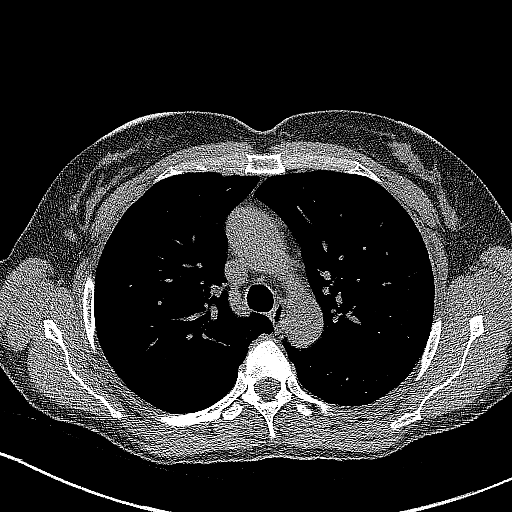
[im 200/293  lung]
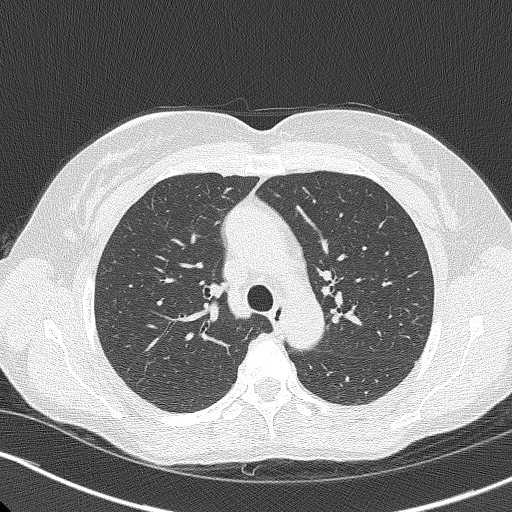
[im 226/293  lung]
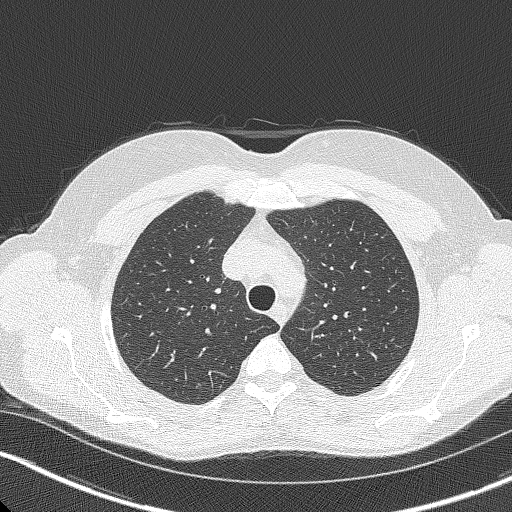
[im 253/293  lung]
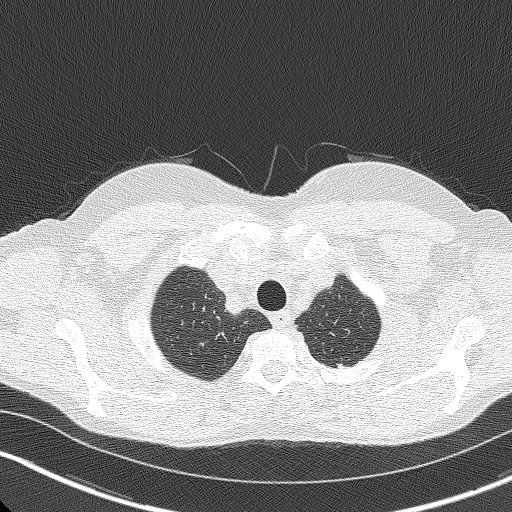
[im 279/293  lung]
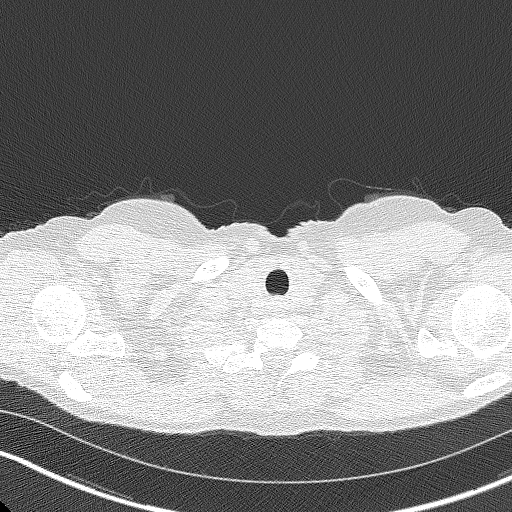

[15 of 40 positions shown; findings below may reference images not displayed]

FINDINGS: Cardiovascular: The heart is normal in size. No pericardial
effusion.

No evidence of thoracic aortic aneurysm.

Mediastinum/Nodes: No suspicious mediastinal lymphadenopathy.

Visualized thyroid is unremarkable.

Lungs/Pleura: Mild biapical pleural-parenchymal scarring.

Scattered small bilateral pulmonary nodules, measuring up to 3.8 mm,
unchanged.

No focal consolidation.

No pleural effusion or pneumothorax.

Upper Abdomen: Visualized upper abdomen is grossly unremarkable.

Musculoskeletal: Mild degenerative changes of the visualized
thoracolumbar spine.
IMPRESSION: Lung-RADS 2, benign appearance or behavior. Continue annual
screening with low-dose chest CT without contrast in 12 months.

## 2023-04-10 ENCOUNTER — Other Ambulatory Visit: Payer: Self-pay | Admitting: Internal Medicine

## 2023-04-10 DIAGNOSIS — F17200 Nicotine dependence, unspecified, uncomplicated: Secondary | ICD-10-CM

## 2023-05-12 ENCOUNTER — Ambulatory Visit
Admission: RE | Admit: 2023-05-12 | Discharge: 2023-05-12 | Disposition: A | Discharge status: Home / Self Care | Payer: Managed Care, Other (non HMO) | Source: Ambulatory Visit | Attending: Internal Medicine | Admitting: Internal Medicine

## 2023-05-12 DIAGNOSIS — F17200 Nicotine dependence, unspecified, uncomplicated: Secondary | ICD-10-CM

## 2024-06-01 ENCOUNTER — Other Ambulatory Visit: Payer: Self-pay | Admitting: Internal Medicine

## 2024-06-01 DIAGNOSIS — F17200 Nicotine dependence, unspecified, uncomplicated: Secondary | ICD-10-CM

## 2024-06-13 ENCOUNTER — Ambulatory Visit
Admission: RE | Admit: 2024-06-13 | Discharge: 2024-06-13 | Disposition: A | Discharge status: Home / Self Care | Source: Ambulatory Visit | Attending: Internal Medicine | Admitting: Internal Medicine

## 2024-06-13 DIAGNOSIS — F17200 Nicotine dependence, unspecified, uncomplicated: Secondary | ICD-10-CM
